# Patient Record
Sex: Female | Born: 1965 | Race: White | Hispanic: No | Marital: Married | Smoking: Never smoker
Health system: Southern US, Community
[De-identification: ages and names within clinical notes are randomized; demographics above are authoritative.]

## PROBLEM LIST (undated history)

## (undated) DIAGNOSIS — F419 Anxiety disorder, unspecified: Secondary | ICD-10-CM

## (undated) DIAGNOSIS — M810 Age-related osteoporosis without current pathological fracture: Secondary | ICD-10-CM

## (undated) HISTORY — PX: CHOLECYSTECTOMY: SHX55

## (undated) HISTORY — PX: BACK SURGERY: SHX140

## (undated) HISTORY — PX: TONSILLECTOMY: SUR1361

## (undated) HISTORY — PX: OTHER SURGICAL HISTORY: SHX169

## (undated) HISTORY — PX: GASTRIC BYPASS: SHX52

## (undated) HISTORY — PX: ABDOMINAL HYSTERECTOMY: SHX81

## (undated) HISTORY — PX: CARPAL TUNNEL RELEASE: SHX101

## (undated) HISTORY — PX: TUBAL LIGATION: SHX77

---
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
## 2002-03-30 ENCOUNTER — Ambulatory Visit (HOSPITAL_COMMUNITY): Admission: RE | Admit: 2002-03-30 | Discharge: 2002-03-30 | Payer: Self-pay | Admitting: Family Medicine

## 2002-03-30 ENCOUNTER — Encounter: Payer: Self-pay | Admitting: Family Medicine

## 2002-09-15 ENCOUNTER — Emergency Department (HOSPITAL_COMMUNITY): Admission: EM | Admit: 2002-09-15 | Discharge: 2002-09-15 | Payer: Self-pay | Admitting: *Deleted

## 2002-09-15 ENCOUNTER — Encounter: Payer: Self-pay | Admitting: *Deleted

## 2002-09-28 ENCOUNTER — Encounter: Payer: Self-pay | Admitting: Family Medicine

## 2002-09-28 ENCOUNTER — Encounter: Admission: RE | Admit: 2002-09-28 | Discharge: 2002-09-28 | Payer: Self-pay | Admitting: Family Medicine

## 2003-02-15 ENCOUNTER — Other Ambulatory Visit: Admission: RE | Admit: 2003-02-15 | Discharge: 2003-02-15 | Payer: Self-pay | Admitting: Obstetrics and Gynecology

## 2003-03-24 ENCOUNTER — Emergency Department (HOSPITAL_COMMUNITY): Admission: EM | Admit: 2003-03-24 | Discharge: 2003-03-24 | Payer: Self-pay | Admitting: Internal Medicine

## 2003-07-20 ENCOUNTER — Emergency Department (HOSPITAL_COMMUNITY): Admission: EM | Admit: 2003-07-20 | Discharge: 2003-07-21 | Payer: Self-pay | Admitting: Emergency Medicine

## 2003-07-20 ENCOUNTER — Encounter: Payer: Self-pay | Admitting: Emergency Medicine

## 2003-08-29 ENCOUNTER — Emergency Department (HOSPITAL_COMMUNITY): Admission: EM | Admit: 2003-08-29 | Discharge: 2003-08-29 | Payer: Self-pay | Admitting: Emergency Medicine

## 2004-06-16 ENCOUNTER — Emergency Department (HOSPITAL_COMMUNITY): Admission: EM | Admit: 2004-06-16 | Discharge: 2004-06-16 | Payer: Self-pay | Admitting: Emergency Medicine

## 2004-11-29 ENCOUNTER — Ambulatory Visit (HOSPITAL_COMMUNITY): Admission: RE | Admit: 2004-11-29 | Discharge: 2004-11-29 | Payer: Self-pay | Admitting: Internal Medicine

## 2004-11-30 ENCOUNTER — Ambulatory Visit (HOSPITAL_COMMUNITY): Admission: RE | Admit: 2004-11-30 | Discharge: 2004-12-01 | Payer: Self-pay | Admitting: Neurosurgery

## 2004-12-08 ENCOUNTER — Emergency Department (HOSPITAL_COMMUNITY): Admission: EM | Admit: 2004-12-08 | Discharge: 2004-12-08 | Payer: Self-pay | Admitting: Emergency Medicine

## 2005-10-10 ENCOUNTER — Ambulatory Visit (HOSPITAL_COMMUNITY): Admission: RE | Admit: 2005-10-10 | Discharge: 2005-10-10 | Payer: Self-pay | Admitting: Internal Medicine

## 2005-11-13 ENCOUNTER — Ambulatory Visit (HOSPITAL_COMMUNITY): Admission: RE | Admit: 2005-11-13 | Discharge: 2005-11-13 | Payer: Self-pay | Admitting: Obstetrics & Gynecology

## 2006-03-27 ENCOUNTER — Ambulatory Visit (HOSPITAL_COMMUNITY): Admission: RE | Admit: 2006-03-27 | Discharge: 2006-03-27 | Payer: Self-pay | Admitting: Orthopaedic Surgery

## 2006-12-12 ENCOUNTER — Ambulatory Visit (HOSPITAL_COMMUNITY): Admission: RE | Admit: 2006-12-12 | Discharge: 2006-12-12 | Payer: Self-pay | Admitting: Family Medicine

## 2010-11-10 ENCOUNTER — Encounter: Payer: Self-pay | Admitting: Internal Medicine

## 2012-05-12 ENCOUNTER — Encounter (HOSPITAL_COMMUNITY): Payer: Self-pay

## 2012-05-12 ENCOUNTER — Emergency Department (HOSPITAL_COMMUNITY)
Admission: EM | Admit: 2012-05-12 | Discharge: 2012-05-12 | Disposition: A | Discharge status: Home / Self Care | Payer: Self-pay | Attending: Emergency Medicine | Admitting: Emergency Medicine

## 2012-05-12 DIAGNOSIS — H109 Unspecified conjunctivitis: Secondary | ICD-10-CM

## 2012-05-12 DIAGNOSIS — H5789 Other specified disorders of eye and adnexa: Secondary | ICD-10-CM | POA: Insufficient documentation

## 2012-05-12 MED ORDER — TOBRAMYCIN 0.3 % OP SOLN
2.0000 [drp] | Freq: Once | OPHTHALMIC | Status: AC
  Administered 2012-05-12: 2 [drp] via OPHTHALMIC
  Filled 2012-05-12: qty 5

## 2012-05-12 MED ORDER — FLUORESCEIN SODIUM 1 MG OP STRP
ORAL_STRIP | OPHTHALMIC | Status: AC
  Administered 2012-05-12: 14:00:00
  Filled 2012-05-12: qty 1

## 2012-05-12 MED ORDER — TETRACAINE HCL 0.5 % OP SOLN
OPHTHALMIC | Status: AC
  Administered 2012-05-12: 14:00:00
  Filled 2012-05-12: qty 2

## 2012-05-12 NOTE — ED Notes (Signed)
Pt c/o r eye irritation since this morning.  Says did some cleaning at work yesterday and thinks may have gotten something in her R eye.

## 2012-05-12 NOTE — ED Provider Notes (Signed)
Medical screening examination/treatment/procedure(s) were performed by non-physician practitioner and as supervising physician I was immediately available for consultation/collaboration.   Shelda Jakes, MD 05/12/12 2038

## 2012-05-12 NOTE — ED Notes (Signed)
Pt c/o rt eye irritation since this am. Pt c/o rt being matted closed this morning and has been tearing up a lot today.

## 2012-05-12 NOTE — ED Notes (Signed)
Patient states she does not wear glasses or contacts. 

## 2012-05-12 NOTE — ED Provider Notes (Signed)
History     CSN: [id number]  Arrival date & time 05/12/12  1306   First MD Initiated Contact with Patient 05/12/12 1318      Chief Complaint  Patient presents with  . Eye Pain    (Consider location/radiation/quality/duration/timing/severity/associated sxs/prior treatment) HPI Comments: States she awakened today with R eye discomfort.  She and co-workers were doing cleaning at work yesterday and thought she might have splashed a cleaning chemical in her eye although does not know that for sure.  Eye matted closed this AM.  Patient is a 46 y.o. female presenting with eye pain. The history is provided by the patient. No language interpreter was used.  Eye Pain This is a new problem. The current episode started today. The problem occurs constantly. The problem has been unchanged. Pertinent negatives include no chills, coughing or visual change. Nothing aggravates the symptoms. She has tried nothing for the symptoms.    History reviewed. No pertinent past medical history.  Past Surgical History  Procedure Date  . Tubal ligation   . Cyst removed from lower back   . Abdominal hysterectomy     partial  . Tonsillectomy   . Cholecystectomy   . Carpal tunnel release     No family history on file.  History  Substance Use Topics  . Smoking status: Never Smoker   . Smokeless tobacco: Not on file  . Alcohol Use: No    OB History    Grav Para Term Preterm Abortions TAB SAB Ect Mult Living                  Review of Systems  Constitutional: Negative for chills.  Eyes: Positive for pain, discharge and redness. Negative for photophobia and visual disturbance.  Respiratory: Negative for cough.   All other systems reviewed and are negative.    Allergies  Penicillins  Home Medications   Current Outpatient Rx  Name Route Sig Dispense Refill  . ALPRAZOLAM 0.5 MG PO TABS Oral Take 0.5 mg by mouth 3 (three) times daily.    . OXYCODONE-ACETAMINOPHEN 5-325 MG PO TABS Oral Take 1  tablet by mouth 2 (two) times daily. Pain    . TETRAHYDROZOLINE-ZN SULFATE 0.05-0.25 % OP SOLN Both Eyes Place 2 drops into both eyes 3 (three) times daily as needed. Clean out eyes      BP 131/85  Pulse 101  Temp 98 F (36.7 C) (Oral)  Resp 18  Ht 5' 2.5" (1.588 m)  Wt 215 lb (97.523 kg)  BMI 38.70 kg/m2  SpO2 99%  Physical Exam  Nursing note and vitals reviewed. Constitutional: She is oriented to person, place, and time. She appears well-developed and well-nourished. No distress.  HENT:  Head: Normocephalic and atraumatic.  Eyes: EOM are normal. Pupils are equal, round, and reactive to light. Right eye exhibits no discharge, no exudate and no hordeolum. Left eye exhibits no discharge, no exudate and no hordeolum. Right conjunctiva is injected. Right conjunctiva has no hemorrhage. Left conjunctiva is not injected. Left conjunctiva has no hemorrhage. No scleral icterus. Right eye exhibits no nystagmus. Left eye exhibits no nystagmus.       anesth R eye with tetracaine and stained with florosceine.  No stain uptake to suggest chemical conjunctivitis/  Neck: Normal range of motion.  Cardiovascular: Normal rate, regular rhythm and normal heart sounds.   Pulmonary/Chest: Effort normal and breath sounds normal.  Abdominal: Soft. She exhibits no distension. There is no tenderness.  Musculoskeletal: Normal range of motion.  Neurological: She is alert and oriented to person, place, and time.  Skin: Skin is warm and dry.  Psychiatric: She has a normal mood and affect. Judgment normal.    ED Course  Procedures (including critical care time)  Labs Reviewed - No data to display No results found.   No diagnosis found.    MDM  Insert 2 drops of tobrex OU QID x 5-7 days. F/u with ophthalmologist of choice.        Evalina Field, [place] 05/12/12 1418

## 2012-08-19 ENCOUNTER — Other Ambulatory Visit (HOSPITAL_COMMUNITY): Payer: Self-pay | Admitting: Family Medicine

## 2012-08-19 DIAGNOSIS — Z139 Encounter for screening, unspecified: Secondary | ICD-10-CM

## 2012-08-24 ENCOUNTER — Ambulatory Visit (HOSPITAL_COMMUNITY)
Admission: RE | Admit: 2012-08-24 | Discharge: 2012-08-24 | Disposition: A | Discharge status: Home / Self Care | Payer: BC Managed Care – PPO | Source: Ambulatory Visit | Attending: Family Medicine | Admitting: Family Medicine

## 2012-08-24 DIAGNOSIS — Z139 Encounter for screening, unspecified: Secondary | ICD-10-CM

## 2012-08-24 DIAGNOSIS — Z1231 Encounter for screening mammogram for malignant neoplasm of breast: Secondary | ICD-10-CM | POA: Insufficient documentation

## 2012-11-16 ENCOUNTER — Emergency Department (HOSPITAL_COMMUNITY)
Admission: EM | Admit: 2012-11-16 | Discharge: 2012-11-16 | Disposition: A | Discharge status: Home / Self Care | Payer: BC Managed Care – PPO | Attending: Emergency Medicine | Admitting: Emergency Medicine

## 2012-11-16 ENCOUNTER — Encounter (HOSPITAL_COMMUNITY): Payer: Self-pay | Admitting: *Deleted

## 2012-11-16 DIAGNOSIS — K0889 Other specified disorders of teeth and supporting structures: Secondary | ICD-10-CM

## 2012-11-16 DIAGNOSIS — K029 Dental caries, unspecified: Secondary | ICD-10-CM | POA: Insufficient documentation

## 2012-11-16 DIAGNOSIS — Z79899 Other long term (current) drug therapy: Secondary | ICD-10-CM | POA: Insufficient documentation

## 2012-11-16 MED ORDER — CLINDAMYCIN HCL 150 MG PO CAPS: ORAL_CAPSULE | ORAL | Status: DC

## 2012-11-16 MED ORDER — NAPROXEN 250 MG PO TABS: 250.0000 mg | ORAL_TABLET | Freq: Two times a day (BID) | ORAL | Status: DC

## 2012-11-16 MED ORDER — HYDROCODONE-ACETAMINOPHEN 5-325 MG PO TABS: ORAL_TABLET | ORAL | Status: DC

## 2012-11-16 NOTE — ED Notes (Signed)
Pain lt mandibular molar for 2 days, sensitive to heat and cold.

## 2012-11-16 NOTE — ED Notes (Signed)
Dental pain to left side x 2 days.  

## 2012-11-16 NOTE — ED Provider Notes (Signed)
History     CSN: [id number]  Arrival date & time 11/16/12  1733   First MD Initiated Contact with Patient 11/16/12 1741      Chief Complaint  Patient presents with  . Dental Pain     HPI Pt was seen at 1750.  Per pt, c/o gradual onset and persistence of constant left lower tooth "pain" for the past several days.  Denies fevers, no intra-oral edema, no rash, no facial swelling, no dysphagia, no neck pain.   The condition is aggravated by nothing. The condition is relieved by nothing. The symptoms have been associated with no other complaints. The patient has no significant history of serious medical conditions.     History reviewed. No pertinent past medical history.  Past Surgical History  Procedure Date  . Tubal ligation   . Cyst removed from lower back   . Abdominal hysterectomy     partial  . Tonsillectomy   . Cholecystectomy   . Carpal tunnel release     History  Substance Use Topics  . Smoking status: Never Smoker   . Smokeless tobacco: Not on file  . Alcohol Use: No      Review of Systems ROS: Statement: All systems negative except as marked or noted in the HPI; Constitutional: Negative for fever and chills. ; ; Eyes: Negative for eye pain and discharge. ; ; ENMT: Positive for dental caries, dental hygiene poor and toothache. Negative for ear pain, bleeding gums, dental injury, facial deformity, facial swelling, hoarseness, nasal congestion, sinus pressure, sore throat, throat swelling and tongue swollen. ; ; Cardiovascular: Negative for chest pain, palpitations, diaphoresis, dyspnea and peripheral edema. ; ; Respiratory: Negative for cough, wheezing and stridor. ; ; Gastrointestinal: Negative for nausea, vomiting, diarrhea and abdominal pain. ; ; Genitourinary: Negative for dysuria, flank pain and hematuria. ; ; Musculoskeletal: Negative for back pain and neck pain. ; ; Skin: Negative for rash and skin lesion. ; ; Neuro: Negative for headache, lightheadedness and neck  stiffness. ;    Allergies  Penicillins  Home Medications   Current Outpatient Rx  Name  Route  Sig  Dispense  Refill  . ALPRAZOLAM 0.5 MG PO TABS   Oral   Take 0.5 mg by mouth 3 (three) times daily.         Marland Kitchen CLINDAMYCIN HCL 150 MG PO CAPS      3 tabs PO TID x 10 days   90 capsule   0   . HYDROCODONE-ACETAMINOPHEN 5-325 MG PO TABS      1 or 2 tabs PO q6 hours prn pain   20 tablet   0   . NAPROXEN 250 MG PO TABS   Oral   Take 1 tablet (250 mg total) by mouth 2 (two) times daily with a meal.   14 tablet   0   . OXYCODONE-ACETAMINOPHEN 5-325 MG PO TABS   Oral   Take 1 tablet by mouth 2 (two) times daily. Pain         . TETRAHYDROZOLINE-ZN SULFATE 0.05-0.25 % OP SOLN   Both Eyes   Place 2 drops into both eyes 3 (three) times daily as needed. Clean out eyes           BP 127/76  Pulse 83  Temp 98 F (36.7 C) (Oral)  Resp 20  Ht 5\' 3"  (1.6 m)  Wt 220 lb (99.791 kg)  BMI 38.97 kg/m2  SpO2 96%  Physical Exam 1755: Physical examination: Vital signs and  O2 SAT: Reviewed; Constitutional: Well developed, Well nourished, Well hydrated, In no acute distress; Head and Face: Normocephalic, Atraumatic; Eyes: EOMI, PERRL, No scleral icterus; ENMT: Mouth and pharynx normal, Poor dentition, Widespread dental decay, Left TM normal, Right TM normal, Mucous membranes moist, +lower left 2nd molar with dental decay.  No gingival erythema, edema, fluctuance, or drainage.  No intra-oral edema. No hoarse voice, no drooling, no stridor.  ; Neck: Supple, Full range of motion, No lymphadenopathy; Cardiovascular: Regular rate and rhythm, No murmur, rub, or gallop; Respiratory: Breath sounds clear & equal bilaterally, No rales, rhonchi, wheezes, or rub, Normal respiratory effort/excursion; Chest: Nontender, Movement normal; Extremities: Pulses normal, No tenderness, No edema; Neuro: AA&Ox3, Major CN grossly intact.  No gross focal motor or sensory deficits in extremities.; Skin: Color  normal, No rash, No petechiae, Warm, Dry   ED Course  Procedures    MDM  MDM Reviewed: nursing note, vitals and previous chart     1800:  Pt encouraged to f/u with dentist or oral surgeon for her dental needs for good continuity of care and definitive treatment.  Verb understanding.         Laray Anger, DO 11/17/12 2206

## 2013-06-22 ENCOUNTER — Ambulatory Visit: Payer: BC Managed Care – PPO | Admitting: Dietician

## 2013-07-02 ENCOUNTER — Encounter: Payer: Self-pay | Admitting: *Deleted

## 2013-07-02 ENCOUNTER — Encounter: Payer: BC Managed Care – PPO | Attending: Family Medicine | Admitting: *Deleted

## 2013-07-02 VITALS — Ht 63.0 in | Wt 236.4 lb

## 2013-07-02 DIAGNOSIS — E669 Obesity, unspecified: Secondary | ICD-10-CM | POA: Insufficient documentation

## 2013-07-02 DIAGNOSIS — Z713 Dietary counseling and surveillance: Secondary | ICD-10-CM | POA: Insufficient documentation

## 2013-07-02 NOTE — Progress Notes (Signed)
  Medical Nutrition Therapy:  Appt start time: 0930 end time:  1030.  Assessment:  Primary concerns today: Krista Fitzgerald is here for nutrition counseling pertaining to obesity.   She has gained 65-70 pounds over the years from stress and inactivity.  She has a strong family history of diabetes, HTN, and hyperlipidemia.  She is currently healthy except for bad back pain.  She's had surgery on her back, but she still in pain. She believes that the pain will get better with weight loss.   She is currently not working and is stressed about her finances.  She would like to have the lap band bariatric surgery.    She had made some changes recently.  Cut out fried foods and is limiting sodas.  She used to eat a lot of chips, sweets, snacks, etc,  She eats mindless because of her depression.  She still eats very few fruits and vegetable or whole grains.  Her fat intake is higher than recommended  MEDICATIONS: see list   DIETARY INTAKE:  Usual eating pattern includes 2-3 meals and multiple snacks per day.  Everyday foods include protein, starch.  Avoided foods include seafood, pork products.  Doesn't eat many fruits or vegeteables  24-hr recall: 1500 calories B ( AM): 1/2 blt or toast with egg Snk ( AM): not usually  L ( PM): lasagna or sandwich  Snk ( PM): almonds or pistachios, fudgesicle D ( PM): pizza Snk ( PM): not usually Beverages: soda sometimes, unsweetened tea  Usual physical activity: walks 20-45 minutes most days  Estimated energy needs: 1500 calories 170 g carbohydrates 112 g protein 42 g fat    Nutritional Diagnosis:  [place]-3.3 Overweight/obesity As related to excessive fat and sugar intake combined with limited phyiscal activity.  As evidenced by BMI>30.    Intervention:  Nutrition counseling provided. Goals:  Eat 3 meals/day, Avoid meal skipping   Increase protein rich foods  Follow "Plate Method" for portion control  Limit carbohydrate1-2 servings/meal   Choose more  whole grains, lean protein, low-fat dairy, and fruits/non-starchy vegetables.   Aim for >30 min of physical activity daily  Limit sugar-sweetened beverages and concentrated sweets 20% protein 55% carb 25% fat  Monitoring/Evaluation:  Dietary intake, exercise, and body weight prn.

## 2013-07-02 NOTE — Patient Instructions (Addendum)
Goals:  Eat 3 meals/day, Avoid meal skipping   Increase protein rich foods  Follow "Plate Method" for portion control  Limit carbohydrate1-2 servings/meal   Choose more whole grains, lean protein, low-fat dairy, and fruits/non-starchy vegetables.   Aim for >30 min of physical activity daily  Limit sugar-sweetened beverages and concentrated sweets   20% protein 55% carb 25% fat

## 2013-08-07 ENCOUNTER — Emergency Department (HOSPITAL_COMMUNITY)
Admission: EM | Admit: 2013-08-07 | Discharge: 2013-08-07 | Disposition: A | Discharge status: Home / Self Care | Payer: No Typology Code available for payment source | Attending: Emergency Medicine | Admitting: Emergency Medicine

## 2013-08-07 ENCOUNTER — Encounter (HOSPITAL_COMMUNITY): Payer: Self-pay | Admitting: Emergency Medicine

## 2013-08-07 ENCOUNTER — Emergency Department (HOSPITAL_COMMUNITY): Payer: No Typology Code available for payment source

## 2013-08-07 DIAGNOSIS — S139XXA Sprain of joints and ligaments of unspecified parts of neck, initial encounter: Secondary | ICD-10-CM | POA: Diagnosis not present

## 2013-08-07 DIAGNOSIS — Z880 Allergy status to penicillin: Secondary | ICD-10-CM | POA: Diagnosis not present

## 2013-08-07 DIAGNOSIS — Y92410 Unspecified street and highway as the place of occurrence of the external cause: Secondary | ICD-10-CM | POA: Insufficient documentation

## 2013-08-07 DIAGNOSIS — S0993XA Unspecified injury of face, initial encounter: Secondary | ICD-10-CM | POA: Diagnosis not present

## 2013-08-07 DIAGNOSIS — Y9389 Activity, other specified: Secondary | ICD-10-CM | POA: Insufficient documentation

## 2013-08-07 DIAGNOSIS — IMO0002 Reserved for concepts with insufficient information to code with codable children: Secondary | ICD-10-CM | POA: Diagnosis present

## 2013-08-07 DIAGNOSIS — Z79899 Other long term (current) drug therapy: Secondary | ICD-10-CM | POA: Diagnosis not present

## 2013-08-07 DIAGNOSIS — Z791 Long term (current) use of non-steroidal anti-inflammatories (NSAID): Secondary | ICD-10-CM | POA: Diagnosis not present

## 2013-08-07 DIAGNOSIS — S161XXA Strain of muscle, fascia and tendon at neck level, initial encounter: Secondary | ICD-10-CM

## 2013-08-07 MED ORDER — HYDROCODONE-ACETAMINOPHEN 5-325 MG PO TABS
1.0000 | ORAL_TABLET | Freq: Once | ORAL | Status: AC
  Administered 2013-08-07: 1 via ORAL
  Filled 2013-08-07: qty 1

## 2013-08-07 MED ORDER — OXYCODONE-ACETAMINOPHEN 5-325 MG PO TABS: 1.0000 | ORAL_TABLET | ORAL | Status: DC | PRN

## 2013-08-07 MED ORDER — CYCLOBENZAPRINE HCL 5 MG PO TABS: 5.0000 mg | ORAL_TABLET | Freq: Three times a day (TID) | ORAL | Status: DC | PRN
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
## 2013-08-07 NOTE — ED Notes (Signed)
c-collar placed in triage, d/t complaints of neck/back pain.

## 2013-08-07 NOTE — ED Provider Notes (Signed)
Medical screening examination/treatment/procedure(s) were performed by non-physician practitioner and as supervising physician I was immediately available for consultation/collaboration.   Lyanne Co, MD 08/07/13 [id number]

## 2013-11-19 ENCOUNTER — Encounter (HOSPITAL_COMMUNITY): Payer: Self-pay | Admitting: Emergency Medicine

## 2013-11-19 ENCOUNTER — Emergency Department (HOSPITAL_COMMUNITY)
Admission: EM | Admit: 2013-11-19 | Discharge: 2013-11-19 | Disposition: A | Discharge status: Home / Self Care | Payer: BC Managed Care – PPO | Attending: Emergency Medicine | Admitting: Emergency Medicine

## 2013-11-19 DIAGNOSIS — K0889 Other specified disorders of teeth and supporting structures: Secondary | ICD-10-CM

## 2013-11-19 DIAGNOSIS — Z880 Allergy status to penicillin: Secondary | ICD-10-CM | POA: Insufficient documentation

## 2013-11-19 DIAGNOSIS — Z791 Long term (current) use of non-steroidal anti-inflammatories (NSAID): Secondary | ICD-10-CM | POA: Insufficient documentation

## 2013-11-19 DIAGNOSIS — K089 Disorder of teeth and supporting structures, unspecified: Secondary | ICD-10-CM | POA: Insufficient documentation

## 2013-11-19 DIAGNOSIS — Z79899 Other long term (current) drug therapy: Secondary | ICD-10-CM | POA: Insufficient documentation

## 2013-11-19 DIAGNOSIS — K029 Dental caries, unspecified: Secondary | ICD-10-CM | POA: Insufficient documentation

## 2013-11-19 DIAGNOSIS — F411 Generalized anxiety disorder: Secondary | ICD-10-CM | POA: Insufficient documentation

## 2013-11-19 HISTORY — DX: Anxiety disorder, unspecified: F41.9

## 2013-11-19 MED ORDER — KETOROLAC TROMETHAMINE 10 MG PO TABS
10.0000 mg | ORAL_TABLET | Freq: Once | ORAL | Status: AC
  Administered 2013-11-19: 10 mg via ORAL
  Filled 2013-11-19: qty 1

## 2013-11-19 MED ORDER — DICLOFENAC SODIUM 75 MG PO TBEC: 75.0000 mg | DELAYED_RELEASE_TABLET | Freq: Two times a day (BID) | ORAL | Status: DC

## 2013-11-19 MED ORDER — CLINDAMYCIN HCL 150 MG PO CAPS: 150.0000 mg | ORAL_CAPSULE | Freq: Four times a day (QID) | ORAL | Status: DC

## 2013-11-19 MED ORDER — CLINDAMYCIN HCL 150 MG PO CAPS
300.0000 mg | ORAL_CAPSULE | Freq: Once | ORAL | Status: AC
  Administered 2013-11-19: 300 mg via ORAL
  Filled 2013-11-19: qty 2
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
## 2013-11-19 NOTE — ED Provider Notes (Signed)
CSN: [id number]     Arrival date & time 11/19/13  2130 History   First MD Initiated Contact with Patient 11/19/13 2155     Chief Complaint  Patient presents with  . Dental Pain   (Consider location/radiation/quality/duration/timing/severity/associated sxs/prior Treatment) Patient is a 48 y.o. female presenting with tooth pain. The history is provided by the patient.  Dental Pain Location:  Upper Upper teeth location:  3/RU 1st molar Quality:  Throbbing and sharp Severity:  Moderate Onset quality:  Gradual Duration:  3 weeks Timing:  Constant Progression:  Worsening (pain to right upper tooth worsening for 3-4 days) Chronicity:  Chronic Context: dental caries and filling fell out   Context: not malocclusion, normal dentition, not recent dental surgery and not trauma   Prior workup: none. Relieved by:  Nothing Worsened by:  Hot food/drink and cold food/drink Ineffective treatments:  NSAIDs (oral narcotics) Associated symptoms: facial pain and gum swelling   Associated symptoms: no congestion, no difficulty swallowing, no drooling, no facial swelling, no fever, no headaches, no neck pain, no neck swelling, no oral bleeding, no oral lesions and no trismus   Risk factors: lack of dental care and periodontal disease   Risk factors: no diabetes and no smoking     Past Medical History  Diagnosis Date  . Anxiety    Past Surgical History  Procedure Laterality Date  . Tubal ligation    . Cyst removed from lower back    . Abdominal hysterectomy      partial  . Tonsillectomy    . Cholecystectomy    . Carpal tunnel release    . Back surgery     Family History  Problem Relation Age of Onset  . Diabetes Brother   . Hyperlipidemia Other   . Hypertension Other   . Heart disease Other   . Stroke Other    History  Substance Use Topics  . Smoking status: Never Smoker   . Smokeless tobacco: Not on file  . Alcohol Use: No   OB History   Grav Para Term Preterm Abortions TAB SAB  Ect Mult Living                 Review of Systems  Constitutional: Negative for fever and appetite change.  HENT: Positive for dental problem. Negative for congestion, drooling, facial swelling, mouth sores, sore throat and trouble swallowing.   Eyes: Negative for pain and visual disturbance.  Musculoskeletal: Negative for neck pain and neck stiffness.  Neurological: Negative for dizziness, facial asymmetry and headaches.  Hematological: Negative for adenopathy.  All other systems reviewed and are negative.    Allergies  Penicillins  Home Medications   Current Outpatient Rx  Name  Route  Sig  Dispense  Refill  . ALPRAZolam (XANAX) 0.5 MG tablet   Oral   Take 0.5 mg by mouth 3 (three) times daily.         Marland Kitchen oxyCODONE-acetaminophen (PERCOCET/ROXICET) 5-325 MG per tablet   Oral   Take 1 tablet by mouth 2 (two) times daily. Pain         . tetrahydrozoline-zinc (VISINE-AC) 0.05-0.25 % ophthalmic solution   Both Eyes   Place 2 drops into both eyes 3 (three) times daily as needed. Clean out eyes         . naproxen (NAPROSYN) 250 MG tablet   Oral   Take 1 tablet (250 mg total) by mouth 2 (two) times daily with a meal.   14 tablet   0  BP 128/68  Pulse 87  Temp(Src) 98.1 F (36.7 C) (Oral)  Resp 20  Ht 5\' 3"  (1.6 m)  Wt 240 lb (108.863 kg)  BMI 42.52 kg/m2  SpO2 98% Physical Exam  Nursing note and vitals reviewed. Constitutional: She is oriented to person, place, and time. She appears well-developed and well-nourished. No distress.  HENT:  Head: Normocephalic and atraumatic.  Right Ear: Tympanic membrane and ear canal normal.  Left Ear: Tympanic membrane and ear canal normal.  Mouth/Throat: Uvula is midline, oropharynx is clear and moist and mucous membranes are normal. No trismus in the jaw. Dental caries present. No dental abscesses or uvula swelling. No tonsillar abscesses.    Dental caries of #3 tooth.  No facial swelling, obvious dental abscess,  trismus, or sublingual abnml.  Patient has multiple dental fillings.  Neck: Normal range of motion. Neck supple.  Cardiovascular: Normal rate, regular rhythm and normal heart sounds.   No murmur heard. Pulmonary/Chest: Effort normal and breath sounds normal. No respiratory distress.  Musculoskeletal: Normal range of motion.  Lymphadenopathy:    She has no cervical adenopathy.  Neurological: She is alert and oriented to person, place, and time. She exhibits normal muscle tone. Coordination normal.  Skin: Skin is warm and dry.    ED Course  Procedures (including critical care time) Labs Review Labs Reviewed - No data to display Imaging Review No results found.  EKG Interpretation   None       MDM    Patient with dental pain. VVS.  She is uncomfortable appearing but non-toxic.  No concerning sx's for Ludwig's angina.  No obvious dental abscess.  Referral for local dentists given.  Will prescribe clindamycin and diclofenac for pain.  Patient stable for discharge.  Shawndale Kilpatrick L. Trisha Mangleriplett, PA-C 11/19/13 2222

## 2013-11-19 NOTE — Discharge Instructions (Signed)
Dental Pain Toothache is pain in or around a tooth. It may get worse with chewing or with cold or heat.  HOME CARE  Your dentist may use a numbing medicine during treatment. If so, you may need to avoid eating until the medicine wears off. Ask your dentist about this.  Only take medicine as told by your dentist or doctor.  Avoid chewing food near the painful tooth until after all treatment is done. Ask your dentist about this. GET HELP RIGHT AWAY IF:   The problem gets worse or new problems appear.  You have a fever.  There is redness and puffiness (swelling) of the face, jaw, or neck.  You cannot open your mouth.  There is pain in the jaw.  There is very bad pain that is not helped by medicine. MAKE SURE YOU:   Understand these instructions.  Will watch your condition.  Will get help right away if you are not doing well or get worse. Document Released: 03/25/2008 Document Revised: 12/30/2011 Document Reviewed: 03/25/2008 [facility] Patient Information 2014 [place], [place].   Emergency Department Resource Guide 1) Find a Doctor and Pay Out of Pocket Although you won't have to find out who is covered by your insurance plan, it is a good idea to ask around and get recommendations. You will then need to call the office and see if the doctor you have chosen will accept you as a new patient and what types of options they offer for patients who are self-pay. Some doctors offer discounts or will set up payment plans for their patients who do not have insurance, but you will need to ask so you aren't surprised when you get to your appointment.  2) Contact Your Local Health Department Not all health departments have doctors that can see patients for sick visits, but many do, so it is worth a call to see if yours does. If you don't know where your local health department is, you can check in your phone book. The CDC also has a tool to help you locate your state's health department, and many  state websites also have listings of all of their local health departments.  3) Find a [facility] If your illness is not likely to be very severe or complicated, you may want to try a walk in clinic. These are popping up all over the country in pharmacies, drugstores, and shopping centers. They're usually staffed by nurse practitioners or physician assistants that have been trained to treat common illnesses and complaints. They're usually fairly quick and inexpensive. However, if you have serious medical issues or chronic medical problems, these are probably not your best option.  No Primary Care Doctor: - Call Health Connect at  [phone] - they can help you locate a primary care doctor that  accepts your insurance, provides certain services, etc. - Physician Referral Service- [phone]  Chronic Pain Problems: Organization         Address  Phone   Notes  [facility]  [phone] Patients need to be referred by their primary care doctor.   Medication Assistance: Organization         Address  Phone   Notes  [facility] [address] [place],  [address] [phone] --Must be a resident of [facility] -- Must have NO insurance coverage whatsoever (no Medicaid/ Medicare, etc.) -- The pt. MUST have a primary care doctor that directs their care regularly and follows  them in the community   [facility]  [phone]   [employer]  [phone]    Agencies that provide inexpensive medical care: Organization         Address  Phone   Notes  [place]  [phone]   [facility]    [phone]   [facility] [place], [place] [address] [phone]   [place] [address], [place] [phone]   [facility]    [phone]   [facility]    [phone]   [place] and  [address], [place] Phone:  [phone], Fax:  [phone] Hours of Operation:  9 am - 6 pm, M-F.  Also accepts Medicaid/Medicare and self-pay.  [facility] for [address], [place] Phone: [phone], Fax: [phone]. Hours of Operation:  8:30 am - 5:30 pm, M-F.  Also accepts Medicaid and self-pay.  [facility] [place] [address] [place] Phone: [phone]   [place], [place], [place] [phone], Ext. [phone] Mondays & Thursdays: 7-9 AM.  First 15 patients are seen on a first come, first serve basis.    [place] Providers:  Organization         Address  Phone   Notes  [facility] [address], [place] [phone] Also accepts self-pay patients.  [facility] [address], [place]  [phone]   [place], [address], [place] [phone]   [facility] [address], [place] [phone]   Lucianne Lei [address], [place]   [phone] Only accepts [place] patients after they have their name applied to their card.   Self-Pay (no insurance) in [facility]:  Organization         Address  Phone   Notes  Sickle Cell Patients, [facility] [place] [phone]   [facility] [place] [phone]   [facility] [place]  [place], [place], [place] [phone]   [facility]/Dr. Osei-Bonsu  [address], [place] or [address], [place] [phone] Phone number for both Institute and [place] locations is the same.  [facility] [address], [place] [phone]   [facility] [address] [place] or [address] [phone]   [facility] [address], [place] [phone], phone; [phone], fax Sees patients 1st and 3rd Saturday of every month.  Must not qualify for public or private insurance (i.e. Medicaid, Medicare, [place] Health Choice, Veterans' Benefits)  Household income should be no more than 200% of the poverty level The clinic cannot treat you if you are pregnant or think you are pregnant  Sexually transmitted diseases are not treated at the clinic.    Dental Care: Organization         Address  Phone  Notes  [facility] [address] [place], [place] [phone] Accepts children up to age 15 who are enrolled  in Medicaid or [place] Health Choice; pregnant women with a Medicaid card; and children who have applied for Medicaid or [place] Health Choice, but were declined, whose parents can pay a reduced fee at time of service.  °[facility] [place]  [address], [place] ([phone]) [phone] Accepts children up to age 21 who are enrolled in Medicaid or [place] Health Choice; pregnant women with a Medicaid card; and children who have applied for Medicaid or [place] Health Choice, but were declined, whose parents can pay a reduced fee at time of service.  °[facility] ° [address], [place] ([phone]) [phone] Patients are seen by appointment only. Walk-ins are not accepted. [facility] will see patients 18 years of age and older. °Monday - Tuesday (8am-5pm) °Most Wednesdays (8:30-5pm) °$30 per visit, cash only  °[facility] ° [address], [place] ([phone]) [phone] Patients are seen by appointment only. Walk-ins are not accepted. [facility] will see patients 18 years of age and older. °One Wednesday Evening (Monthly: Volunteer Based).  $30 per visit, cash only  °[facility]  [phone] for adults; Children under age 4, call Graduate Pediatric Dentistry at [phone]. Children aged 4-14, please call [phone] to request a pediatric application. ° Dental services are provided in all areas of dental care including fillings, crowns and bridges, complete and partial dentures, implants, gum treatment, root canals, and extractions. Preventive care is also provided. Treatment is provided to both adults and children. °Patients are selected via a lottery and there is often a waiting list. °  °[facility] [address], °[place] ° ([phone]) [phone] [email or web address] °  °[facility] [address], [place], [place] (336)[phone], Ext. [phone] Second and Fourth Thursday of each month, opens at 6:30 AM; Clinic ends at 9 AM.  Patients are seen on a first-come first-served basis, and a limited number are seen during each clinic.  ° °[facility] ° [address], [place], [place] ([phone]) [phone]   Eligibility Requirements °You must have lived in [place], [place], or [place] counties for at least the last three months. °  You cannot be eligible for state or federal sponsored healthcare insurance, including Veterans Administration, Medicaid, or Medicare. °  You generally cannot be eligible for healthcare insurance through your employer.  °  How to apply: °Eligibility screenings are held every Tuesday and Wednesday afternoon from 1:00 pm until 4:00 pm. You do not need an appointment for the interview!  °[facility] [address], [place], [place] [phone]   °[facility]  336-342-8273   °[facility]  336-703-3100   °[facility]  336-570-6415   ° °Behavioral Health Resources in the Community: °Intensive Outpatient Programs °Organization         Address  Phone  Notes  °[facility] [address], [place], [place]  336-878-6098   ° Health Outpatient [address], [place], [place] 336-832-9800   °[facility] [address], [place], [place] ° 336-882-2125   °[facility] [address],  °[place], [place] 1-800-853-5163 or 336-641-4981   °Substance Abuse Resources °Organization         Address  Phone  Notes  °[facility]  336-882-2125   °[facility]  336-784-9470   °  [facility]  336-285-9073   °[facility]  336-845-3988   °Residential & Outpatient Substance Abuse Program  1-800-659-3381   °Psychological Services °Organization         Address  Phone  Notes  °[facility]  336- 832-9600   °[facility]  336- 378-7881   °[facility] [address], [place] 1-800-853-5163 or 336-641-4981   ° °Mobile Crisis Teams °Organization         Address  Phone  Notes  °[facility], Mobile Crisis Care Unit  1-877-626-1772   °Assertive °[facility] ° [address] [place], [place] 336-834-9664   °Sharon DeEsch [address] °[place] 336-554-5454   ° °Self-Help/Support Groups °Organization         Address  Phone             Notes  °[facility] [place] - variety of support groups  336- 373-1402 Call for more information  °Narcotics Anonymous (NA), [facility] [address], °[place]  2 meetings at this location  ° °Residential Treatment Programs °Organization         Address  Phone  Notes  °[facility] [address],    °[place]  1-866-801-8205   °[facility] ° [address], [place], [place] [phone]   °[facility] [address], [place] 336-845-3988 Admissions: 8am-3pm M-F  °[facility] [address]    °[place], [place] 336-841-1104   °[facility] [address], [place], [place] 336-379-7146   °[facility] [address]    °[place], [place] 336-285-9073   °[facility] - Intensive Outpatient [address], [place], [place] 336-852-3033   °[facility] [address]  °[place], [place] 1-877-615-2722 or 336-784-9470   °[facility] ([facility]) [address] [place], [place] 336-227-7417 Accepts Medicaid  °[facility] [address]  ° [place] 1-800-659-3381 Substance Abuse/Addiction Treatment  ° °[place] [facility] Resources °Organization         Address  Phone  Notes  °[facility]  (888) 581-9988   °Julie Brannon, PhD [address] [place], [place]   (336) 349-5553 or (336) 951-0000   °[facility]   [address] °[place], [place] (336) 349-4454   °[facility] [address], [place], [place] (336) 342-8316 Insurance/Medicaid/sponsorship through [facility]  °[facility] [address]                                    [place], [place] (336) 342-8316 Therapy/tele-psych/case  °[facility] [address]  ° [place], [place] (336) 349-2233    °Dr. Arfeen  (336) 349-4544   °[facility] [place]  [employer] [facility] [facility] 1) [address], [place] °2) [address], [place] °3)  [address], [place] (336) 349-3220 °(336) 342-7768 ° °(336) 342-8140   °[place] Child Abuse Hotline (336) 342-1394 or (336) 342-3537 (After Hours)    ° ° ° °

## 2013-11-19 NOTE — ED Provider Notes (Signed)
Medical screening examination/treatment/procedure(s) were performed by non-physician practitioner and as supervising physician I was immediately available for consultation/collaboration.  EKG Interpretation   None         Benny LennertJoseph L Michail Boyte, MD 11/19/13 2255

## 2014-03-18 ENCOUNTER — Other Ambulatory Visit: Payer: Self-pay | Admitting: Radiology

## 2014-03-25 ENCOUNTER — Encounter (HOSPITAL_COMMUNITY): Payer: Self-pay | Admitting: Pharmacy Technician

## 2014-03-25 ENCOUNTER — Encounter (HOSPITAL_COMMUNITY)
Admission: RE | Admit: 2014-03-25 | Discharge: 2014-03-25 | Disposition: A | Discharge status: Home / Self Care | Payer: BC Managed Care – PPO | Source: Ambulatory Visit | Attending: Orthopaedic Surgery | Admitting: Orthopaedic Surgery

## 2014-03-25 ENCOUNTER — Encounter (HOSPITAL_COMMUNITY): Payer: Self-pay

## 2014-03-25 DIAGNOSIS — Z01812 Encounter for preprocedural laboratory examination: Secondary | ICD-10-CM | POA: Insufficient documentation

## 2014-03-25 LAB — URINE MICROSCOPIC-ADD ON

## 2014-03-25 LAB — URINALYSIS, ROUTINE W REFLEX MICROSCOPIC
BILIRUBIN URINE: NEGATIVE
Glucose, UA: NEGATIVE mg/dL
Ketones, ur: NEGATIVE mg/dL
Leukocytes, UA: NEGATIVE
Nitrite: NEGATIVE
PH: 6 (ref 5.0–8.0)
Protein, ur: NEGATIVE mg/dL
SPECIFIC GRAVITY, URINE: 1.025 (ref 1.005–1.030)
Urobilinogen, UA: 0.2 mg/dL (ref 0.0–1.0)

## 2014-03-25 LAB — COMPREHENSIVE METABOLIC PANEL
ALT: 21 U/L (ref 0–35)
AST: 21 U/L (ref 0–37)
Albumin: 3.4 g/dL — ABNORMAL LOW (ref 3.5–5.2)
Alkaline Phosphatase: 73 U/L (ref 39–117)
BUN: 11 mg/dL (ref 6–23)
CALCIUM: 9.2 mg/dL (ref 8.4–10.5)
CO2: 26 mEq/L (ref 19–32)
Chloride: 103 mEq/L (ref 96–112)
Creatinine, Ser: 0.66 mg/dL (ref 0.50–1.10)
GFR calc Af Amer: >90 mL/min (ref >90)
GFR calc non Af Amer: >90 mL/min (ref >90)
Glucose, Bld: 161 mg/dL — ABNORMAL HIGH (ref 70–99)
Potassium: 4.3 mEq/L (ref 3.7–5.3)
SODIUM: 140 meq/L (ref 137–147)
TOTAL PROTEIN: 7.4 g/dL (ref 6.0–8.3)
Total Bilirubin: 0.2 mg/dL — ABNORMAL LOW (ref 0.3–1.2)

## 2014-03-25 LAB — CBC WITH DIFFERENTIAL/PLATELET
Basophils Absolute: 0 10*3/uL (ref 0.0–0.1)
Basophils Relative: 0 % (ref 0–1)
EOS ABS: 0.2 10*3/uL (ref 0.0–0.7)
EOS PCT: 4 % (ref 0–5)
HCT: 36.4 % (ref 36.0–46.0)
Hemoglobin: 12.1 g/dL (ref 12.0–15.0)
Lymphocytes Relative: 40 % (ref 12–46)
Lymphs Abs: 2.5 10*3/uL (ref 0.7–4.0)
MCH: 29.7 pg (ref 26.0–34.0)
MCHC: 33.2 g/dL (ref 30.0–36.0)
MCV: 89.4 fL (ref 78.0–100.0)
Monocytes Absolute: 0.2 10*3/uL (ref 0.1–1.0)
Monocytes Relative: 4 % (ref 3–12)
NEUTROS PCT: 53 % (ref 43–77)
Neutro Abs: 3.3 10*3/uL (ref 1.7–7.7)
Platelets: 321 10*3/uL (ref 150–400)
RBC: 4.07 MIL/uL (ref 3.87–5.11)
RDW: 12.5 % (ref 11.5–15.5)
WBC: 6.3 10*3/uL (ref 4.0–10.5)

## 2014-03-25 NOTE — Patient Instructions (Signed)
Krista Fitzgerald  03/25/2014   Your procedure is scheduled on:  04/07/2014  Report to [facility] at  615  AM.  Call this number if you have problems the morning of surgery: [phone]   Remember:   Do not eat food or drink liquids after midnight.   Take these medicines the morning of surgery with A SIP OF WATER:  Xanax, oxycodone   Do not wear jewelry, make-up or nail polish.  Do not wear lotions, powders, or perfumes.   Do not shave 48 hours prior to surgery. Men may shave face and neck.  Do not bring valuables to the hospital.  [facility] is not responsible for any belongings or valuables.               Contacts, dentures or bridgework may not be worn into surgery.  Leave suitcase in the car. After surgery it may be brought to your room.  For patients admitted to the hospital, discharge time is determined by your treatment team.               Patients discharged the day of surgery will not be allowed to drive home.  Name and phone number of your driver: family  Special Instructions: Shower using CHG 2 nights before surgery and the night before surgery.  If you shower the day of surgery use CHG.  Use special wash - you have one bottle of CHG for all showers.  You should use approximately 1/3 of the bottle for each shower.   Please read over the following fact sheets that you were given: Pain Booklet, Coughing and Deep Breathing, Surgical Site Infection Prevention, Anesthesia Post-op Instructions and Care and Recovery After Surgery Carpal Tunnel Release Carpal tunnel release is done to relieve the pressure on the nerves and tendons on the bottom side of your wrist.  LET YOUR CAREGIVER KNOW ABOUT:   Allergies to food or medicine.  Medicines taken, including vitamins, herbs, eyedrops, over-the-counter medicines, and creams.  Use of steroids (by mouth or creams).  Previous problems with anesthetics or numbing medicines.  History of bleeding problems or blood  clots.  Previous surgery.  Other health problems, including diabetes and kidney problems.  Possibility of pregnancy, if this applies. RISKS AND COMPLICATIONS  Some problems that may happen after this procedure include:  Infection.  Damage to the nerves, arteries or tendons could occur. This would be very uncommon.  Bleeding. BEFORE THE PROCEDURE   This surgery may be done while you are asleep (general anesthetic) or may be done under a block where only your forearm and the surgical area is numb.  If the surgery is done under a block, the numbness will gradually wear off within several hours after surgery. HOME CARE INSTRUCTIONS   Have a responsible person with you for 24 hours.  Do not drive a car or use public transportation for 24 hours.  Only take over-the-counter or prescription medicines for pain, discomfort, or fever as directed by your caregiver. Take them as directed.  You may put ice on the palm side of the affected wrist.  Put ice in a plastic bag.  Place a towel between your skin and the bag.  Leave the ice on for 20 to 30 minutes, 4 times per day.  If you were given a splint to keep your wrist from bending, use it as directed. It is important to wear the splint at night or as directed. Use the splint for  as long as you have pain or numbness in your hand, arm, or wrist. This may take 1 to 2 months.  Keep your hand raised (elevated) above the level of your heart as much as possible. This keeps swelling down and helps with discomfort.  Change bandages (dressings) as directed.  Keep the wound clean and dry. SEEK MEDICAL CARE IF:   You develop pain not relieved with medications.  You develop numbness of your hand.  You develop bleeding from your surgical site.  You have an oral temperature above 102 F (38.9 C).  You develop redness or swelling of the surgical site.  You develop new, unexplained problems. SEEK IMMEDIATE MEDICAL CARE IF:   You develop  a rash.  You have difficulty breathing.  You develop any reaction or side effects to medications given. Document Released: 12/28/2003 Document Revised: 12/30/2011 Document Reviewed: 08/13/2007 [facility] Patient Information 2014 [place]. PATIENT INSTRUCTIONS POST-ANESTHESIA  IMMEDIATELY FOLLOWING SURGERY:  Do not drive or operate machinery for the first twenty four hours after surgery.  Do not make any important decisions for twenty four hours after surgery or while taking narcotic pain medications or sedatives.  If you develop intractable nausea and vomiting or a severe headache please notify your doctor immediately.  FOLLOW-UP:  Please make an appointment with your surgeon as instructed. You do not need to follow up with anesthesia unless specifically instructed to do so.  WOUND CARE INSTRUCTIONS (if applicable):  Keep a dry clean dressing on the anesthesia/puncture wound site if there is drainage.  Once the wound has quit draining you may leave it open to air.  Generally you should leave the bandage intact for twenty four hours unless there is drainage.  If the epidural site drains for more than 36-48 hours please call the anesthesia department.  QUESTIONS?:  Please feel free to call your physician or the hospital operator if you have any questions, and they will be happy to assist you.

## 2014-04-01 ENCOUNTER — Other Ambulatory Visit (HOSPITAL_COMMUNITY): Payer: BC Managed Care – PPO

## 2014-04-06 NOTE — H&P (Signed)
Krista Fitzgerald is an 48 y.o. female.   Chief Complaint: Carpal tunnel right HPI: She had carpal tunnel surgery on the left many years ago.  She was told she would need it on the right but has kept putting it off until now.  She has night paresthesias and pain.  She has tried various conservative treatments with no help.  She is getting worse. She would like to have surgery on the right wrist now. She is familiar with the procedure and risks and imponderables of this elective procedure.  Past Medical History  Diagnosis Date  . Anxiety     Past Surgical History  Procedure Laterality Date  . Tubal ligation    . Cyst removed from lower back    . Abdominal hysterectomy      partial  . Tonsillectomy    . Cholecystectomy    . Carpal tunnel release    . Back surgery      Family History  Problem Relation Age of Onset  . Diabetes Brother   . Hyperlipidemia Other   . Hypertension Other   . Heart disease Other   . Stroke Other    Social History:  reports that she has never smoked. She does not have any smokeless tobacco history on file. She reports that she uses illicit drugs (Marijuana). She reports that she does not drink alcohol.  Allergies:  Allergies  Allergen Reactions  . Penicillins Anaphylaxis    No prescriptions prior to admission    No results found for this or any previous visit (from the past 48 hour(s)). No results found.  Review of Systems  Musculoskeletal: Positive for myalgias (pain in the right wrist for some time.  She had left carpal tunnel release years ago and kept putting off surgery on the right wrist.).    There were no vitals taken for this visit. Physical Exam  Constitutional: She is oriented to person, place, and time. She appears well-developed and well-nourished.  HENT:  Head: Normocephalic and atraumatic.  Eyes: Conjunctivae and EOM are normal. Pupils are equal, round, and reactive to light.  Neck: Normal range of motion. Neck supple.   Cardiovascular: Normal rate, regular rhythm, normal heart sounds and intact distal pulses.   Respiratory: Effort normal and breath sounds normal.  GI: Soft. Bowel sounds are normal.  Musculoskeletal: She exhibits tenderness (pain of right wrist ventrally with positive Phalen and Tinel sign. Old healed scar of left wrist.).  Neurological: She is alert and oriented to person, place, and time. She has normal reflexes.  Skin: Skin is warm and dry.  Psychiatric: She has a normal mood and affect. Her behavior is normal. Judgment and thought content normal.     Assessment/Plan Carpal tunnel right.  For surgical release as outpatient.  KEELING,WAYNE 04/06/2014, 1:46 PM

## 2014-04-07 ENCOUNTER — Encounter (HOSPITAL_COMMUNITY): Payer: Self-pay | Admitting: *Deleted

## 2014-04-07 ENCOUNTER — Encounter (HOSPITAL_COMMUNITY): Payer: BC Managed Care – PPO | Admitting: Anesthesiology

## 2014-04-07 ENCOUNTER — Encounter (HOSPITAL_COMMUNITY)
Admission: RE | Disposition: A | Discharge status: Home / Self Care | Payer: Self-pay | Source: Ambulatory Visit | Attending: Orthopaedic Surgery

## 2014-04-07 ENCOUNTER — Ambulatory Visit (HOSPITAL_COMMUNITY): Payer: BC Managed Care – PPO | Admitting: Anesthesiology

## 2014-04-07 ENCOUNTER — Ambulatory Visit (HOSPITAL_COMMUNITY)
Admission: RE | Admit: 2014-04-07 | Discharge: 2014-04-07 | Disposition: A | Discharge status: Home / Self Care | Payer: BC Managed Care – PPO | Source: Ambulatory Visit | Attending: Orthopaedic Surgery | Admitting: Orthopaedic Surgery

## 2014-04-07 DIAGNOSIS — F411 Generalized anxiety disorder: Secondary | ICD-10-CM | POA: Insufficient documentation

## 2014-04-07 DIAGNOSIS — G5600 Carpal tunnel syndrome, unspecified upper limb: Secondary | ICD-10-CM | POA: Insufficient documentation

## 2014-04-07 HISTORY — PX: CARPAL TUNNEL RELEASE: SHX101

## 2014-04-07 LAB — GLUCOSE, CAPILLARY: Glucose-Capillary: 100 mg/dL — ABNORMAL HIGH (ref 70–99)

## 2014-04-07 SURGERY — CARPAL TUNNEL RELEASE
Anesthesia: Monitor Anesthesia Care | Site: Hand | Laterality: Right

## 2014-04-07 MED ORDER — FENTANYL CITRATE 0.05 MG/ML IJ SOLN: 25.0000 ug | INTRAMUSCULAR | Status: DC | PRN

## 2014-04-07 MED ORDER — SODIUM CHLORIDE 0.9 % IJ SOLN
INTRAMUSCULAR | Status: AC
  Filled 2014-04-07: qty 10

## 2014-04-07 MED ORDER — CHLORHEXIDINE GLUCONATE 4 % EX LIQD: 60.0000 mL | Freq: Once | CUTANEOUS | Status: DC

## 2014-04-07 MED ORDER — SODIUM CHLORIDE 0.9 % IR SOLN
Status: DC | PRN
  Administered 2014-04-07: 1000 mL

## 2014-04-07 MED ORDER — SODIUM CHLORIDE 0.9 % IJ SOLN
10.0000 mL | Freq: Once | INTRAMUSCULAR | Status: AC
  Administered 2014-04-07: 10 mL via INTRAVENOUS

## 2014-04-07 MED ORDER — FENTANYL CITRATE 0.05 MG/ML IJ SOLN
25.0000 ug | INTRAMUSCULAR | Status: AC
  Administered 2014-04-07 (×2): 25 ug via INTRAVENOUS
  Filled 2014-04-07: qty 2

## 2014-04-07 MED ORDER — MIDAZOLAM HCL 2 MG/2ML IJ SOLN
1.0000 mg | INTRAMUSCULAR | Status: DC | PRN
  Administered 2014-04-07: 2 mg via INTRAVENOUS

## 2014-04-07 MED ORDER — ONDANSETRON HCL 4 MG/2ML IJ SOLN: 4.0000 mg | Freq: Once | INTRAMUSCULAR | Status: DC | PRN

## 2014-04-07 MED ORDER — FENTANYL CITRATE 0.05 MG/ML IJ SOLN
INTRAMUSCULAR | Status: DC | PRN
  Administered 2014-04-07: 25 ug via INTRAVENOUS

## 2014-04-07 MED ORDER — LACTATED RINGERS IV SOLN
INTRAVENOUS | Status: DC
  Administered 2014-04-07: 07:00:00 via INTRAVENOUS

## 2014-04-07 MED ORDER — ONDANSETRON HCL 4 MG/2ML IJ SOLN
4.0000 mg | Freq: Once | INTRAMUSCULAR | Status: AC
  Administered 2014-04-07: 4 mg via INTRAVENOUS
  Filled 2014-04-07: qty 2

## 2014-04-07 MED ORDER — FENTANYL CITRATE 0.05 MG/ML IJ SOLN
INTRAMUSCULAR | Status: AC
  Filled 2014-04-07: qty 2

## 2014-04-07 MED ORDER — LIDOCAINE HCL (PF) 1 % IJ SOLN
INTRAMUSCULAR | Status: AC
  Filled 2014-04-07: qty 5

## 2014-04-07 MED ORDER — SODIUM CHLORIDE 0.9 % IJ SOLN
INTRAMUSCULAR | Status: DC | PRN
  Administered 2014-04-07: 1 mL

## 2014-04-07 MED ORDER — PROPOFOL INFUSION 10 MG/ML OPTIME
INTRAVENOUS | Status: DC | PRN
  Administered 2014-04-07: 30 ug/kg/min via INTRAVENOUS

## 2014-04-07 MED ORDER — LIDOCAINE HCL (PF) 0.5 % IJ SOLN
INTRAMUSCULAR | Status: DC | PRN
  Administered 2014-04-07: 250 mg via INTRAVENOUS

## 2014-04-07 MED ORDER — MIDAZOLAM HCL 2 MG/2ML IJ SOLN
INTRAMUSCULAR | Status: AC
  Filled 2014-04-07: qty 2

## 2014-04-07 MED ORDER — PROPOFOL 10 MG/ML IV BOLUS
INTRAVENOUS | Status: AC
  Filled 2014-04-07: qty 20

## 2014-04-07 SURGICAL SUPPLY — 41 items
BAG HAMPER (MISCELLANEOUS) ×3 IMPLANT
BANDAGE ELASTIC 3 VELCRO NS (GAUZE/BANDAGES/DRESSINGS) ×3 IMPLANT
BANDAGE ESMARK 4X12 BL STRL LF (DISPOSABLE) ×1 IMPLANT
BLADE 15 SAFETY STRL DISP (BLADE) ×3 IMPLANT
BNDG CMPR 12X4 ELC STRL LF (DISPOSABLE) ×1
BNDG ESMARK 4X12 BLUE STRL LF (DISPOSABLE) ×3
CLOTH BEACON ORANGE TIMEOUT ST (SAFETY) ×3 IMPLANT
COVER LIGHT HANDLE STERIS (MISCELLANEOUS) ×6 IMPLANT
CUFF TOURNIQUET SINGLE 18IN (TOURNIQUET CUFF) ×3 IMPLANT
DRSG XEROFORM 1X8 (GAUZE/BANDAGES/DRESSINGS) ×3 IMPLANT
DURAPREP 26ML APPLICATOR (WOUND CARE) ×3 IMPLANT
ELECT NEEDLE TIP 2.8 STRL (NEEDLE) IMPLANT
ELECT REM PT RETURN 9FT ADLT (ELECTROSURGICAL) ×3
ELECTRODE REM PT RTRN 9FT ADLT (ELECTROSURGICAL) ×1 IMPLANT
FORMALIN 10 PREFIL 120ML (MISCELLANEOUS) ×3 IMPLANT
GAUZE SPONGE 4X4 12PLY STRL (GAUZE/BANDAGES/DRESSINGS) ×2 IMPLANT
GLOVE BIO SURGEON STRL SZ8 (GLOVE) ×3 IMPLANT
GLOVE BIO SURGEON STRL SZ8.5 (GLOVE) ×3 IMPLANT
GLOVE BIOGEL PI IND STRL 7.0 (GLOVE) ×1 IMPLANT
GLOVE BIOGEL PI IND STRL 8.5 (GLOVE) ×1 IMPLANT
GLOVE BIOGEL PI INDICATOR 7.0 (GLOVE) ×2
GLOVE BIOGEL PI INDICATOR 8.5 (GLOVE) ×2
GLOVE OPTIFIT SS 6.5 STRL BRWN (GLOVE) ×3 IMPLANT
GLOVE OPTIFIT SS 8.5 STRL (GLOVE) ×3 IMPLANT
GOWN STRL REUS W/TWL LRG LVL3 (GOWN DISPOSABLE) ×3 IMPLANT
GOWN STRL REUS W/TWL XL LVL3 (GOWN DISPOSABLE) ×6 IMPLANT
KIT ROOM TURNOVER APOR (KITS) ×3 IMPLANT
NEEDLE HYPO 18GX1.5 BLUNT FILL (NEEDLE) ×3 IMPLANT
NEEDLE HYPO 27GX1-1/4 (NEEDLE) ×3 IMPLANT
NS IRRIG 1000ML POUR BTL (IV SOLUTION) ×3 IMPLANT
PACK BASIC LIMB (CUSTOM PROCEDURE TRAY) ×3 IMPLANT
PAD ARMBOARD 7.5X6 YLW CONV (MISCELLANEOUS) ×3 IMPLANT
PAD CAST 3X4 CTTN HI CHSV (CAST SUPPLIES) ×1 IMPLANT
PADDING CAST COTTON 3X4 STRL (CAST SUPPLIES) ×3
PADDING WEBRIL 3 STERILE (GAUZE/BANDAGES/DRESSINGS) ×3 IMPLANT
SET BASIN LINEN APH (SET/KITS/TRAYS/PACK) ×3 IMPLANT
SPONGE GAUZE 4X4 12PLY (GAUZE/BANDAGES/DRESSINGS) ×3 IMPLANT
SUT ETHILON 3 0 FSL (SUTURE) ×3 IMPLANT
SYR 3ML LL SCALE MARK (SYRINGE) ×3 IMPLANT
TOWEL OR 17X26 4PK STRL BLUE (TOWEL DISPOSABLE) ×3 IMPLANT
VESSEL LOOPS MAXI RED (MISCELLANEOUS) ×3 IMPLANT

## 2014-04-07 NOTE — Progress Notes (Signed)
The History and Physical is unchanged. I have examined the patient. The patient is medically able to have surgery on the right volar wrist. . KEELING,WAYNE 

## 2014-04-07 NOTE — Brief Op Note (Signed)
04/07/2014  8:10 AM  PATIENT:  Krista Fitzgerald  48 y.o. female  PRE-OPERATIVE DIAGNOSIS:  carpal tunnel syndrome right wrist  POST-OPERATIVE DIAGNOSIS:  carpal tunnel syndrome right wrist  PROCEDURE:  Procedure(s): CARPAL TUNNEL RELEASE (Right)  SURGEON:  Surgeon(s) and Role:    * Darreld McleanWayne Kelii Chittum, MD - Primary  PHYSICIAN ASSISTANT:   ASSISTANTS: none   ANESTHESIA:   regional  EBL:  Total I/O In: 600 [I.V.:600] Out: 0   BLOOD ADMINISTERED:none  DRAINS: none   LOCAL MEDICATIONS USED:  NONE  SPECIMEN:  Source of Specimen:  right volar carpal ligament  DISPOSITION OF SPECIMEN:  PATHOLOGY  COUNTS:  YES  TOURNIQUET:   Total Tourniquet Time Documented: Upper Arm (Right) - 31 minutes Total: Upper Arm (Right) - 31 minutes   DICTATION: .Other Dictation: Dictation Number [id number]  PLAN OF CARE: Discharge to home after PACU  PATIENT DISPOSITION:  PACU - hemodynamically stable.   Delay start of Pharmacological VTE agent (>24hrs) due to surgical blood loss or risk of bleeding: not applicable

## 2014-04-07 NOTE — Transfer of Care (Signed)
Immediate Anesthesia Transfer of Care Note  Patient: Krista CoriaMichelle Fitzgerald  Procedure(s) Performed: Procedure(s): CARPAL TUNNEL RELEASE (Right)  Patient Location: PACU  Anesthesia Type:MAC  Level of Consciousness: awake, alert  and oriented  Airway & Oxygen Therapy: Patient Spontanous Breathing  Post-op Assessment: Report given to PACU RN  Post vital signs: Reviewed  Complications: No apparent anesthesia complications

## 2014-04-07 NOTE — Anesthesia Preprocedure Evaluation (Signed)
Anesthesia Evaluation  Patient identified by MRN, date of birth, ID band Patient awake    Reviewed: Allergy & Precautions, H&P , NPO status , Patient's Chart, lab work & pertinent test results  History of Anesthesia Complications Negative for: history of anesthetic complications  Airway Mallampati: II TM Distance: >3 FB Neck ROM: Full    Dental  (+) Teeth Intact   Pulmonary neg pulmonary ROS,  breath sounds clear to auscultation        Cardiovascular negative cardio ROS  Rhythm:Regular Rate:Normal     Neuro/Psych PSYCHIATRIC DISORDERS Anxiety    GI/Hepatic negative GI ROS,   Endo/Other    Renal/GU      Musculoskeletal   Abdominal   Peds  Hematology   Anesthesia Other Findings   Reproductive/Obstetrics                           Anesthesia Physical Anesthesia Plan  ASA: II  Anesthesia Plan: MAC and Bier Block   Post-op Pain Management:    Induction: Intravenous  Airway Management Planned: Nasal Cannula  Additional Equipment:   Intra-op Plan:   Post-operative Plan:   Informed Consent: I have reviewed the patients History and Physical, chart, labs and discussed the procedure including the risks, benefits and alternatives for the proposed anesthesia with the patient or authorized representative who has indicated his/her understanding and acceptance.     Plan Discussed with:   Anesthesia Plan Comments:         Anesthesia Quick Evaluation

## 2014-04-07 NOTE — Anesthesia Postprocedure Evaluation (Signed)
  Anesthesia Post-op Note  Patient: Krista Fitzgerald  Procedure(s) Performed: Procedure(s): CARPAL TUNNEL RELEASE (Right)  Patient Location: PACU  Anesthesia Type:MAC  Level of Consciousness: awake, alert  and oriented  Airway and Oxygen Therapy: Patient Spontanous Breathing  Post-op Pain: none  Post-op Assessment: Post-op Vital signs reviewed, Patient's Cardiovascular Status Stable, Respiratory Function Stable, Patent Airway and No signs of Nausea or vomiting  Post-op Vital Signs: Reviewed and stable  Last Vitals:  Filed Vitals:   04/07/14 0700  BP: 114/69  Temp:   Resp: 15    Complications: No apparent anesthesia complications

## 2014-04-07 NOTE — Op Note (Signed)
NAMCarlene Coria:  GORDON, Krista Fitzgerald             ACCOUNT NO.:  [id number]  MEDICAL RECORD NO.:  [id number]  LOCATION:                                 FACILITY:  PHYSICIAN:  J. Darreld McleanWayne Keeling, M.D. DATE OF BIRTH:  1966/09/21  DATE OF PROCEDURE:  04/07/2014 DATE OF DISCHARGE:  04/07/2014                              OPERATIVE REPORT   PREOPERATIVE DIAGNOSIS:  Carpal tunnel syndrome, right.  POSTOPERATIVE DIAGNOSIS:  Carpal tunnel syndrome, right.  PROCEDURE:  Release of volar carpal ligament, saline neurolysis, aponeurotomy, right median nerve.  ANESTHESIA:  Regional Bier block.  TOURNIQUET TIME:  Thirty one minutes.  SURGEON:  J. Darreld McleanWayne Keeling, MD  DRAINS:  No drains.  SPLINT:  No splint.  INDICATIONS:  The patient's carpal tunnel syndrome, several months, not getting well.  She had a release on the other side years ago, and delayed having release on this side, but more recently she has carpal tunnel syndrome symptoms that return, rather significantly she is not getting better.  She has not improved with conservative treatment, surgery is now recommended.  She understands the risks and imponderables.  DESCRIPTION OF PROCEDURE:  The patient was seen in the holding area. The right hand was identified as correct surgical site and a mark was placed on the right wrist.  The patient was taken to the operating room, placed supine and given Bier block anesthesia, was hand table attached. She prepped and draped in usual manner.  A time-out identifying the patient, Ms. Roger ShelterGordon, we are doing a right hand for carpal tunnel release.  OR team knew each other.  All instrumentation properly positioned and working.  Outline for incision was made with  careful dissection, the median nerve was identified proximally and the vessel loop placed around the nerve. A grooved director was then used and then the volar carpal ligament was incised.  Specimen of volar carpal ligament was sent to Pathology.   The nerve was obviously compressed.  Retinaculum cut proximally.  Saline neurolysis carried out and then aponeurotomy carried out.  No apparent injury to the nerve.  The wound was reinspected, no apparent injury and the wounds were then reapproximated after removing the vessel loop using 3-0 nylon interrupted in vertical mattress manner.  Wounds were cleaned. Xeroform was applied and sterile dressing applied, bulky dressing applied.  Sheet cotton applied, sheet cotton cut dorsally.  ACE bandage applied loosely.  The patient tolerated procedure well, will go to recovery in good condition. Appropriate analgesic given for pain.  I will see her in my office in 1 week.          ______________________________ J. Darreld McleanWayne Keeling, M.D.     JWK/MEDQ  D:  04/07/2014  T:  04/07/2014  Job:  [id number]

## 2014-04-07 NOTE — Discharge Instructions (Signed)
Keep hand dry.  Keep dressing intact.  Do not remove dressing.  Elevate hand as needed.  Move fingers often.  Appointment to see Dr. Hilda LiasKeeling in one week.  Call [phone] if any problem or if after hours, the hospital at [phone].  Continue your present pain medicine.

## 2014-04-07 NOTE — Anesthesia Procedure Notes (Signed)
Anesthesia Regional Block:  Bier block (IV Regional)  Pre-Anesthetic Checklist: ,, timeout performed, Correct Patient, Correct Site, Correct Laterality, Correct Procedure,, site marked, surgical consent,, at surgeon's request Needles:  Injection technique: Single-shot  Needle Type: Other      Needle Gauge: 22 and 22 G    Additional Needles: Bier block (IV Regional)  Nerve Stimulator or Paresthesia:   Additional Responses:  Pulse checked post tourniquet inflation. IV NSL discontinued post injection. Narrative:   Performed by: Personally     

## 2014-04-08 ENCOUNTER — Encounter (HOSPITAL_COMMUNITY): Payer: Self-pay | Admitting: Orthopaedic Surgery

## 2014-12-14 DIAGNOSIS — F419 Anxiety disorder, unspecified: Secondary | ICD-10-CM | POA: Insufficient documentation

## 2014-12-19 DIAGNOSIS — Z980 Intestinal bypass and anastomosis status: Secondary | ICD-10-CM | POA: Insufficient documentation

## 2014-12-19 DIAGNOSIS — F1110 Opioid abuse, uncomplicated: Secondary | ICD-10-CM | POA: Insufficient documentation

## 2014-12-19 DIAGNOSIS — Z9884 Bariatric surgery status: Secondary | ICD-10-CM | POA: Insufficient documentation

## 2016-01-12 DIAGNOSIS — K269 Duodenal ulcer, unspecified as acute or chronic, without hemorrhage or perforation: Secondary | ICD-10-CM | POA: Insufficient documentation

## 2016-12-27 DIAGNOSIS — K219 Gastro-esophageal reflux disease without esophagitis: Secondary | ICD-10-CM | POA: Insufficient documentation

## 2020-03-21 DIAGNOSIS — M899 Disorder of bone, unspecified: Secondary | ICD-10-CM | POA: Insufficient documentation

## 2020-08-11 DIAGNOSIS — M5136 Other intervertebral disc degeneration, lumbar region: Secondary | ICD-10-CM | POA: Insufficient documentation

## 2020-08-30 DIAGNOSIS — E041 Nontoxic single thyroid nodule: Secondary | ICD-10-CM | POA: Insufficient documentation

## 2020-08-30 DIAGNOSIS — M5416 Radiculopathy, lumbar region: Secondary | ICD-10-CM | POA: Insufficient documentation

## 2021-08-10 DIAGNOSIS — M3589 Other specified systemic involvement of connective tissue: Secondary | ICD-10-CM | POA: Insufficient documentation

## 2021-10-02 ENCOUNTER — Encounter (HOSPITAL_COMMUNITY): Payer: Self-pay

## 2021-10-02 ENCOUNTER — Emergency Department
Admission: EM | Admit: 2021-10-02 | Discharge: 2021-10-02 | Discharge status: Against Medical Advice | Payer: Self-pay | Source: Home / Self Care

## 2021-10-02 ENCOUNTER — Emergency Department (HOSPITAL_COMMUNITY): Payer: Self-pay

## 2021-10-02 ENCOUNTER — Emergency Department (HOSPITAL_COMMUNITY)
Admission: EM | Admit: 2021-10-02 | Discharge: 2021-10-02 | Disposition: A | Discharge status: Home / Self Care | Payer: Self-pay | Attending: Emergency Medicine | Admitting: Emergency Medicine

## 2021-10-02 ENCOUNTER — Other Ambulatory Visit: Payer: Self-pay

## 2021-10-02 DIAGNOSIS — S42341A Displaced spiral fracture of shaft of humerus, right arm, initial encounter for closed fracture: Secondary | ICD-10-CM | POA: Insufficient documentation

## 2021-10-02 DIAGNOSIS — Z23 Encounter for immunization: Secondary | ICD-10-CM | POA: Insufficient documentation

## 2021-10-02 DIAGNOSIS — S42201A Unspecified fracture of upper end of right humerus, initial encounter for closed fracture: Secondary | ICD-10-CM

## 2021-10-02 DIAGNOSIS — W010XXA Fall on same level from slipping, tripping and stumbling without subsequent striking against object, initial encounter: Secondary | ICD-10-CM | POA: Insufficient documentation

## 2021-10-02 DIAGNOSIS — M25511 Pain in right shoulder: Secondary | ICD-10-CM | POA: Insufficient documentation

## 2021-10-02 DIAGNOSIS — Y9301 Activity, walking, marching and hiking: Secondary | ICD-10-CM | POA: Insufficient documentation

## 2021-10-02 DIAGNOSIS — S42331A Displaced oblique fracture of shaft of humerus, right arm, initial encounter for closed fracture: Secondary | ICD-10-CM | POA: Insufficient documentation

## 2021-10-02 MED ORDER — TETANUS-DIPHTH-ACELL PERTUSSIS 5-2.5-18.5 LF-MCG/0.5 IM SUSY
0.5000 mL | PREFILLED_SYRINGE | Freq: Once | INTRAMUSCULAR | Status: AC
  Administered 2021-10-02: 0.5 mL via INTRAMUSCULAR
  Filled 2021-10-02: qty 0.5

## 2021-10-02 MED ORDER — OXYCODONE-ACETAMINOPHEN 5-325 MG PO TABS: 1.0000 | ORAL_TABLET | Freq: Four times a day (QID) | ORAL | 0 refills | Status: DC | PRN

## 2021-10-02 MED ORDER — HYDROCODONE-ACETAMINOPHEN 5-325 MG PO TABS
1.0000 | ORAL_TABLET | Freq: Once | ORAL | Status: AC
  Administered 2021-10-02: 1 via ORAL
  Filled 2021-10-02: qty 1

## 2021-10-02 MED ORDER — HYDROMORPHONE HCL 1 MG/ML IJ SOLN
1.0000 mg | Freq: Once | INTRAMUSCULAR | Status: AC
  Administered 2021-10-02: 1 mg via INTRAMUSCULAR
  Filled 2021-10-02: qty 1

## 2021-10-02 NOTE — Discharge Instructions (Signed)
Call Dr. Mort Sawyers office today for an appointment in the next 2 days.  Wear the splint and sling as prescribed and do not use your right arm for anything.  Take the pain medication as prescribed.  Return to the ED with worsening pain, weakness, numbness, tingling, any other concerns

## 2021-10-02 NOTE — ED Triage Notes (Addendum)
Pov from home. Pt tripped in the dark over a round paver. Landed on her right shoulder. Hurts to move it. No LOC. Was at [facility] but was told she had a 12 hour wait so she brought herself here.  Took tylenol at 0130. Said she cannot take oral NSAIDs because of a wright loss surgery.

## 2021-10-02 NOTE — ED Provider Notes (Signed)
[facility] EMERGENCY DEPARTMENT Provider Note   CSN: [id number] Arrival date & time: 10/02/21  4098     History Chief Complaint  Patient presents with   Arm Injury    Krista Fitzgerald is a 55 y.o. female.  Patient states she fell while walking outside in the dark.  She tripped over a stone paver in the driveway and landed on her right shoulder.  Also scraped her left knee.  Denies hitting head or losing consciousness.  Fall happened about 130.  Did not hit her head or lose consciousness.  Denies any neck or back pain.  Denies any blood thinner use.  No other injury.  No chest pain or shortness of breath.  No abdominal pain. Unknown last tetanus. No focal weakness, numbness or tingling.  States she has a history of an unknown autoimmune disease as well as weak bones She first presented to [facility] but left without being seen  The history is provided by the patient.  Arm Injury Associated symptoms: no back pain, no fever and no neck pain       Past Medical History:  Diagnosis Date   Anxiety     There are no problems to display for this patient.   Past Surgical History:  Procedure Laterality Date   ABDOMINAL HYSTERECTOMY     partial   BACK SURGERY     CARPAL TUNNEL RELEASE     CARPAL TUNNEL RELEASE Right 04/07/2014   Procedure: CARPAL TUNNEL RELEASE;  Surgeon: Darreld Mclean, MD;  Location: [facility];  Service: Orthopedics;  Laterality: Right;   CHOLECYSTECTOMY     cyst removed from lower back     TONSILLECTOMY     TUBAL LIGATION       OB History   No obstetric history on file.     Family History  Problem Relation Age of Onset   Diabetes Brother    Hyperlipidemia Other    Hypertension Other    Heart disease Other    Stroke Other     Social History   Tobacco Use   Smoking status: Never  Substance Use Topics   Alcohol use: No   Drug use: Yes    Types: Marijuana    Home Medications Prior to Admission medications   Medication Sig Start Date End  Date Taking? Authorizing Provider  ALPRAZolam Prudy Feeler) 0.5 MG tablet Take 0.5 mg by mouth 3 (three) times daily.    [provider]  naproxen (NAPROSYN) 250 MG tablet Take 1 tablet (250 mg total) by mouth 2 (two) times daily with a meal. 11/16/12   Samuel Jester, DO  oxyCODONE-acetaminophen (PERCOCET) 7.5-325 MG per tablet Take 1 tablet by mouth 2 (two) times daily.    [provider]  pravastatin (PRAVACHOL) 40 MG tablet Take 40 mg by mouth at bedtime.    [provider]    Allergies    Penicillins  Review of Systems   Review of Systems  Constitutional:  Negative for activity change, appetite change and fever.  HENT:  Negative for congestion and rhinorrhea.   Respiratory:  Negative for cough, chest tightness and shortness of breath.   Cardiovascular:  Negative for chest pain.  Gastrointestinal:  Negative for abdominal pain, nausea and vomiting.  Genitourinary:  Negative for dysuria.  Musculoskeletal:  Positive for arthralgias and myalgias. Negative for back pain and neck pain.  Skin:  Negative for rash.  Neurological:  Negative for dizziness, weakness and headaches.   all other systems are negative except  as noted in the HPI and PMH.   Physical Exam Updated Vital Signs BP 114/82   Pulse 87   Temp 98 F (36.7 C)   Resp 20   Ht 5\' 3"  (1.6 m)   Wt 106.1 kg   SpO2 100%   BMI 41.43 kg/m   Physical Exam Vitals and nursing note reviewed.  Constitutional:      General: She is not in acute distress.    Appearance: She is well-developed.  HENT:     Head: Normocephalic and atraumatic.     Mouth/Throat:     Pharynx: No oropharyngeal exudate.  Eyes:     Conjunctiva/sclera: Conjunctivae normal.     Pupils: Pupils are equal, round, and reactive to light.  Neck:     Comments: No C spine tenderness Cardiovascular:     Rate and Rhythm: Normal rate and regular rhythm.     Heart sounds: Normal heart sounds. No murmur heard. Pulmonary:     Effort:  Pulmonary effort is normal. No respiratory distress.     Breath sounds: Normal breath sounds.  Abdominal:     Palpations: Abdomen is soft.     Tenderness: There is no abdominal tenderness. There is no guarding or rebound.  Musculoskeletal:        General: Swelling, tenderness, deformity and signs of injury present.     Cervical back: Normal range of motion and neck supple.     Comments: Diffuse tenderness to right anterior lateral shoulder and proximal humerus.  Questionable. deformity.  Intact radial pulse. Cardinal hand movements intact.  No pain to palpation of hand, wrist or elbow.  Abrasion L knee  Skin:    General: Skin is warm.  Neurological:     Mental Status: She is alert and oriented to person, place, and time.     Cranial Nerves: No cranial nerve deficit.     Motor: No abnormal muscle tone.     Coordination: Coordination normal.     Comments:  5/5 strength throughout. CN 2-12 intact.Equal grip strength.   Psychiatric:        Behavior: Behavior normal.    ED Results / Procedures / Treatments   Labs (all labs ordered are listed, but only abnormal results are displayed) Labs Reviewed - No data to display  EKG None  Radiology DG Shoulder Right  Result Date: 10/02/2021 CLINICAL DATA:  Fall injury with right shoulder pain. EXAM: RIGHT SHOULDER - 2+ VIEW COMPARISON:  None. FINDINGS: There is normal bone mineralization with acute spiral/oblique fracture of the proximal right humeral shaft, with a large nondisplaced butterfly comminution fragment at the lateral aspect extending up to the greater tuberosity and with the main distal fragment showing 1/2 of a shaft width medial displacement and mild abduction. The glenohumeral and AC joints are intact. No further fractures are visible. IMPRESSION: Spiral/oblique proximal right humeral shaft fracture, with the main distal fragment 1/2 shaft width medially displaced and mildly abducted and with a large nondisplaced butterfly  comminution fragment at the lateral aspect extending up to the greater tuberosity. Electronically Signed   By: 10/04/2021 M.D.   On: 10/02/2021 05:00   DG Humerus Right  Result Date: 10/02/2021 CLINICAL DATA:  55 year old female with history of trauma from a fall landing on to her right shoulder. Right arm pain. EXAM: RIGHT HUMERUS - 2+ VIEW COMPARISON:  No priors. FINDINGS: Acute displaced, minimally comminuted and angulated spiral type fracture of the proximal third of the right humeral diaphysis. There is a  proximally 35 degrees of medial angulation at the fracture. The proximal aspect of the fracture line extends toward the greater tuberosity of the proximal humerus. Tiny fracture fragment noted medially. Overlying soft tissues are grossly unremarkable. IMPRESSION: 1. Acute displaced, minimally comminuted and angulated spiral type fracture of the proximal third of the right humeral diaphysis, as above. Electronically Signed   By: Trudie Reed M.D.   On: 10/02/2021 05:14    Procedures Procedures   Medications Ordered in ED Medications  HYDROcodone-acetaminophen (NORCO/VICODIN) 5-325 MG per tablet 1 tablet (has no administration in time range)  Tdap (BOOSTRIX) injection 0.5 mL (has no administration in time range)    ED Course  I have reviewed the triage vital signs and the nursing notes.  Pertinent labs & imaging results that were available during my care of the patient were reviewed by me and considered in my medical decision making (see chart for details).    MDM Rules/Calculators/A&P                          Mechanical fall with right shoulder injury.  Neurovascular intact.  No head or neck trauma.  Xray with spiral comminuted proximal humerus fracture with angulation and butterfly fragment.   D/w Dr. Romeo Apple. He recommends immobilization in coaptation splint and sling, pain control and urgent office followup.   Patient instructed to call today for an appointment.  Narcotic database reviewed and short course of opiate medication provided.   Return precautions discussed.  Final Clinical Impression(s) / ED Diagnoses Final diagnoses:  Closed fracture of proximal end of right humerus, unspecified fracture morphology, initial encounter    Rx / DC Orders ED Discharge Orders     None        Artina Minella, Jeannett Senior, MD 10/02/21 [phone]

## 2021-10-03 ENCOUNTER — Ambulatory Visit (INDEPENDENT_AMBULATORY_CARE_PROVIDER_SITE_OTHER): Payer: Self-pay | Admitting: Orthopedic Surgery

## 2021-10-03 ENCOUNTER — Encounter (HOSPITAL_COMMUNITY): Payer: Self-pay

## 2021-10-03 ENCOUNTER — Other Ambulatory Visit: Payer: Self-pay

## 2021-10-03 ENCOUNTER — Encounter: Payer: Self-pay | Admitting: Orthopedic Surgery

## 2021-10-03 ENCOUNTER — Encounter (HOSPITAL_COMMUNITY)
Admission: RE | Admit: 2021-10-03 | Discharge: 2021-10-03 | Disposition: A | Discharge status: Home / Self Care | Payer: Medicaid Other | Source: Ambulatory Visit | Attending: Orthopedic Surgery | Admitting: Orthopedic Surgery

## 2021-10-03 VITALS — BP 119/85 | HR 80 | Ht 63.0 in | Wt 139.0 lb

## 2021-10-03 DIAGNOSIS — S42351A Displaced comminuted fracture of shaft of humerus, right arm, initial encounter for closed fracture: Secondary | ICD-10-CM | POA: Insufficient documentation

## 2021-10-03 DIAGNOSIS — Z01812 Encounter for preprocedural laboratory examination: Secondary | ICD-10-CM | POA: Insufficient documentation

## 2021-10-03 DIAGNOSIS — W19XXXA Unspecified fall, initial encounter: Secondary | ICD-10-CM

## 2021-10-03 DIAGNOSIS — X58XXXA Exposure to other specified factors, initial encounter: Secondary | ICD-10-CM | POA: Insufficient documentation

## 2021-10-03 HISTORY — DX: Age-related osteoporosis without current pathological fracture: M81.0

## 2021-10-03 LAB — BASIC METABOLIC PANEL
Anion gap: 11 (ref 5–15)
BUN: 10 mg/dL (ref 6–20)
CO2: 20 mmol/L — ABNORMAL LOW (ref 22–32)
Calcium: 8.8 mg/dL — ABNORMAL LOW (ref 8.9–10.3)
Chloride: 103 mmol/L (ref 98–111)
Creatinine, Ser: 0.77 mg/dL (ref 0.44–1.00)
GFR, Estimated: >60 mL/min (ref >60)
Glucose, Bld: 99 mg/dL (ref 70–99)
Potassium: 3.8 mmol/L (ref 3.5–5.1)
Sodium: 134 mmol/L — ABNORMAL LOW (ref 135–145)

## 2021-10-03 LAB — CBC WITH DIFFERENTIAL/PLATELET
Abs Immature Granulocytes: 0.02 10*3/uL (ref 0.00–0.07)
Basophils Absolute: 0 10*3/uL (ref 0.0–0.1)
Basophils Relative: 0 %
Eosinophils Absolute: 0 10*3/uL (ref 0.0–0.5)
Eosinophils Relative: 0 %
HCT: 40.5 % (ref 36.0–46.0)
Hemoglobin: 13.5 g/dL (ref 12.0–15.0)
Immature Granulocytes: 0 %
Lymphocytes Relative: 18 %
Lymphs Abs: 1.7 10*3/uL (ref 0.7–4.0)
MCH: 31.5 pg (ref 26.0–34.0)
MCHC: 33.3 g/dL (ref 30.0–36.0)
MCV: 94.6 fL (ref 80.0–100.0)
Monocytes Absolute: 0.6 10*3/uL (ref 0.1–1.0)
Monocytes Relative: 7 %
Neutro Abs: 7.2 10*3/uL (ref 1.7–7.7)
Neutrophils Relative %: 75 %
Platelets: 357 10*3/uL (ref 150–400)
RBC: 4.28 MIL/uL (ref 3.87–5.11)
RDW: 13.2 % (ref 11.5–15.5)
WBC: 9.5 10*3/uL (ref 4.0–10.5)
nRBC: 0 % (ref 0.0–0.2)

## 2021-10-03 NOTE — Patient Instructions (Signed)
BONNYE HALLE  10/03/2021     @PREFPERIOPPHARMACY @   Your procedure is scheduled on  10/05/2021.   Report to 10/07/2021 at  [place]  AM.    Call this number if you have problems the morning of surgery:  [phone]   Remember:  Do not eat or drink after midnight.      Take these medicines the morning of surgery with A SIP OF WATER         Xanax(if needed), oxycodone (if needed).     Do not wear jewelry, make-up or nail polish.  Do not wear lotions, powders, or perfumes, or deodorant.  Do not shave 48 hours prior to surgery.  Men may shave face and neck.  Do not bring valuables to the hospital.  [facility] is not responsible for any belongings or valuables.  Contacts, dentures or bridgework may not be worn into surgery.  Leave your suitcase in the car.  After surgery it may be brought to your room.  For patients admitted to the hospital, discharge time will be determined by your treatment team.  Patients discharged the day of surgery will not be allowed to drive home and must have someone with them for 24 hours.    Special instructions:  DO NOT smoke tobacco or vape for 24 hours before your procedure.  Please read over the following fact sheets that you were given. Coughing and Deep Breathing, Surgical Site Infection Prevention, Anesthesia Post-op Instructions, and Care and Recovery After Surgery       Closed Reduction for Wrist or Forearm A closed reduction for the wrist or forearm is a procedure to move the bones back into place after they are broken (fractured) or moved out of their normal position (dislocated). In this procedure, the health care provider moves the bones back into position by hand. This procedure is done without an incision or surgery. Your forearm is made up of two long bones called the radius and the ulna. These bones connect your forearm to your wrist. If a forearm bone is fractured on the end closest to the wrist joint, sometimes the  bones do not move very far out of place (stable fracture). You may have a closed reduction if the fractured or displaced bones of your wrist or forearm will stay stable with a cast or splint after they are moved back into their normal positions. Tell a health care provider about: Any allergies you have. All medicines you are taking, including vitamins, herbs, eye drops, creams, and over-the-counter medicines. Any problems you or family members have had with anesthetic medicines. Any blood disorders you have. Any surgeries you have had. Any medical conditions you have. Whether you are pregnant or may be pregnant. What are the risks? Generally, this is a safe procedure. However, problems may occur, including: Infection. Bleeding. Allergic reactions to medicines. Damage to nerves or blood vessels. Failure to heal. Continued pain or stiffness in the wrist. What happens before the procedure? Staying hydrated Follow instructions from your health care provider about hydration, which may include: Up to 2 hours before the procedure - you may continue to drink clear liquids, such as water, clear fruit juice, black coffee, and plain tea. Eating and drinking restrictions Follow instructions from your health care provider about eating and drinking, which may include: 8 hours before the procedure - stop eating heavy meals or foods, such as meat, fried foods, or fatty foods. 6 hours before the procedure -  stop eating light meals or foods, such as toast or cereal. 6 hours before the procedure - stop drinking milk or drinks that contain milk. 2 hours before the procedure - stop drinking clear liquids. Medicines Ask your health care provider about: Changing or stopping your regular medicines. This is especially important if you are taking diabetes medicines or blood thinners. Taking medicines such as aspirin and ibuprofen. These medicines can thin your blood. Do not take these medicines unless your  health care provider tells you to take them. Taking over-the-counter medicines, vitamins, herbs, and supplements. General instructions You may have a physical exam. The exam may include X-rays to find out more about your condition. Plan to have someone take you home from the hospital or clinic. If you will be going home right after the procedure, plan to have someone with you for 24 hours. Ask your health care provider what steps will be taken to help prevent infection. These may include washing your skin with a germ-killing soap. What happens during the procedure? An IV may be inserted into one of your veins. You may be given one or more of the following: A medicine to help you relax (sedative). A medicine to make you fall asleep (general anesthetic). A medicine that is injected into an area of your body to numb everything below the injection site (regional anesthetic). Your health care provider will move the broken bones back into their normal position. You will have more X-rays to make sure the bones are in the right position. You will have to wear a cast or splint to hold the bones in place while they heal. The procedure may vary among health care providers and hospitals. What happens after the procedure?  Your blood pressure, heart rate, breathing rate, and blood oxygen level will be monitored until you leave the hospital or clinic. Your arm and hand will be raised (elevated). You will be given medicine for pain as needed. Do not drive for 24 hours if you were given a sedative during your procedure. Summary A closed reduction for your wrist or forearm is used when you have broken or dislocated one or more bones in your arm. A closed reduction puts the bones back in their normal position without an incision or surgery. After the closed reduction procedure, your wrist or forearm will be placed in a splint or cast. This information is not intended to replace advice given to you by your  health care provider. Make sure you discuss any questions you have with your health care provider. Document Revised: 04/29/2019 Document Reviewed: 04/29/2019 Elsevier Patient Education  2022 [employer]. General Anesthesia, Adult, Care After This sheet gives you information about how to care for yourself after your procedure. Your health care provider may also give you more specific instructions. If you have problems or questions, contact your health care provider. What can I expect after the procedure? After the procedure, the following side effects are common: Pain or discomfort at the IV site. Nausea. Vomiting. Sore throat. Trouble concentrating. Feeling cold or chills. Feeling weak or tired. Sleepiness and fatigue. Soreness and body aches. These side effects can affect parts of the body that were not involved in surgery. Follow these instructions at home: For the time period you were told by your health care provider:  Rest. Do not participate in activities where you could fall or become injured. Do not drive or use machinery. Do not drink alcohol. Do not take sleeping pills or medicines that cause drowsiness.  Do not make important decisions or sign legal documents. Do not take care of children on your own. Eating and drinking Follow any instructions from your health care provider about eating or drinking restrictions. When you feel hungry, start by eating small amounts of foods that are soft and easy to digest (bland), such as toast. Gradually return to your regular diet. Drink enough fluid to keep your urine pale yellow. If you vomit, rehydrate by drinking water, juice, or clear broth. General instructions If you have sleep apnea, surgery and certain medicines can increase your risk for breathing problems. Follow instructions from your health care provider about wearing your sleep device: Anytime you are sleeping, including during daytime naps. While taking prescription pain  medicines, sleeping medicines, or medicines that make you drowsy. Have a responsible adult stay with you for the time you are told. It is important to have someone help care for you until you are awake and alert. Return to your normal activities as told by your health care provider. Ask your health care provider what activities are safe for you. Take over-the-counter and prescription medicines only as told by your health care provider. If you smoke, do not smoke without supervision. Keep all follow-up visits as told by your health care provider. This is important. Contact a health care provider if: You have nausea or vomiting that does not get better with medicine. You cannot eat or drink without vomiting. You have pain that does not get better with medicine. You are unable to pass urine. You develop a skin rash. You have a fever. You have redness around your IV site that gets worse. Get help right away if: You have difficulty breathing. You have chest pain. You have blood in your urine or stool, or you vomit blood. Summary After the procedure, it is common to have a sore throat or nausea. It is also common to feel tired. Have a responsible adult stay with you for the time you are told. It is important to have someone help care for you until you are awake and alert. When you feel hungry, start by eating small amounts of foods that are soft and easy to digest (bland), such as toast. Gradually return to your regular diet. Drink enough fluid to keep your urine pale yellow. Return to your normal activities as told by your health care provider. Ask your health care provider what activities are safe for you. This information is not intended to replace advice given to you by your health care provider. Make sure you discuss any questions you have with your health care provider. Document Revised: 06/22/2020 Document Reviewed: 01/20/2020 Elsevier Patient Education  2022 [employer]. How to Use  Chlorhexidine for Bathing Chlorhexidine gluconate (CHG) is a germ-killing (antiseptic) solution that is used to clean the skin. It can get rid of the bacteria that normally live on the skin and can keep them away for about 24 hours. To clean your skin with CHG, you may be given: A CHG solution to use in the shower or as part of a sponge bath. A prepackaged cloth that contains CHG. Cleaning your skin with CHG may help lower the risk for infection: While you are staying in the intensive care unit of the hospital. If you have a vascular access, such as a central line, to provide short-term or long-term access to your veins. If you have a catheter to drain urine from your bladder. If you are on a ventilator. A ventilator is a machine that  helps you breathe by moving air in and out of your lungs. After surgery. What are the risks? Risks of using CHG include: A skin reaction. Hearing loss, if CHG gets in your ears and you have a perforated eardrum. Eye injury, if CHG gets in your eyes and is not rinsed out. The CHG product catching fire. Make sure that you avoid smoking and flames after applying CHG to your skin. Do not use CHG: If you have a chlorhexidine allergy or have previously reacted to chlorhexidine. On babies younger than 69 months of age. How to use CHG solution Use CHG only as told by your health care provider, and follow the instructions on the label. Use the full amount of CHG as directed. Usually, this is one bottle. During a shower Follow these steps when using CHG solution during a shower (unless your health care provider gives you different instructions): Start the shower. Use your normal soap and shampoo to wash your face and hair. Turn off the shower or move out of the shower stream. Pour the CHG onto a clean washcloth. Do not use any type of brush or rough-edged sponge. Starting at your neck, lather your body down to your toes. Make sure you follow these instructions: If  you will be having surgery, pay special attention to the part of your body where you will be having surgery. Scrub this area for at least 1 minute. Do not use CHG on your head or face. If the solution gets into your ears or eyes, rinse them well with water. Avoid your genital area. Avoid any areas of skin that have broken skin, cuts, or scrapes. Scrub your back and under your arms. Make sure to wash skin folds. Let the lather sit on your skin for 1-2 minutes or as long as told by your health care provider. Thoroughly rinse your entire body in the shower. Make sure that all body creases and crevices are rinsed well. Dry off with a clean towel. Do not put any substances on your body afterward--such as powder, lotion, or perfume--unless you are told to do so by your health care provider. Only use lotions that are recommended by the manufacturer. Put on clean clothes or pajamas. If it is the night before your surgery, sleep in clean sheets.  During a sponge bath Follow these steps when using CHG solution during a sponge bath (unless your health care provider gives you different instructions): Use your normal soap and shampoo to wash your face and hair. Pour the CHG onto a clean washcloth. Starting at your neck, lather your body down to your toes. Make sure you follow these instructions: If you will be having surgery, pay special attention to the part of your body where you will be having surgery. Scrub this area for at least 1 minute. Do not use CHG on your head or face. If the solution gets into your ears or eyes, rinse them well with water. Avoid your genital area. Avoid any areas of skin that have broken skin, cuts, or scrapes. Scrub your back and under your arms. Make sure to wash skin folds. Let the lather sit on your skin for 1-2 minutes or as long as told by your health care provider. Using a different clean, wet washcloth, thoroughly rinse your entire body. Make sure that all body creases  and crevices are rinsed well. Dry off with a clean towel. Do not put any substances on your body afterward--such as powder, lotion, or perfume--unless you are told  to do so by your health care provider. Only use lotions that are recommended by the manufacturer. Put on clean clothes or pajamas. If it is the night before your surgery, sleep in clean sheets. How to use CHG prepackaged cloths Only use CHG cloths as told by your health care provider, and follow the instructions on the label. Use the CHG cloth on clean, dry skin. Do not use the CHG cloth on your head or face unless your health care provider tells you to. When washing with the CHG cloth: Avoid your genital area. Avoid any areas of skin that have broken skin, cuts, or scrapes. Before surgery Follow these steps when using a CHG cloth to clean before surgery (unless your health care provider gives you different instructions): Using the CHG cloth, vigorously scrub the part of your body where you will be having surgery. Scrub using a back-and-forth motion for 3 minutes. The area on your body should be completely wet with CHG when you are done scrubbing. Do not rinse. Discard the cloth and let the area air-dry. Do not put any substances on the area afterward, such as powder, lotion, or perfume. Put on clean clothes or pajamas. If it is the night before your surgery, sleep in clean sheets.  For general bathing Follow these steps when using CHG cloths for general bathing (unless your health care provider gives you different instructions). Use a separate CHG cloth for each area of your body. Make sure you wash between any folds of skin and between your fingers and toes. Wash your body in the following order, switching to a new cloth after each step: The front of your neck, shoulders, and chest. Both of your arms, under your arms, and your hands. Your stomach and groin area, avoiding the genitals. Your right leg and foot. Your left leg and  foot. The back of your neck, your back, and your buttocks. Do not rinse. Discard the cloth and let the area air-dry. Do not put any substances on your body afterward--such as powder, lotion, or perfume--unless you are told to do so by your health care provider. Only use lotions that are recommended by the manufacturer. Put on clean clothes or pajamas. Contact a health care provider if: Your skin gets irritated after scrubbing. You have questions about using your solution or cloth. You swallow any chlorhexidine. Call your local poison control center ([phone] in the [place] Get help right away if: Your eyes itch badly, or they become very red or swollen. Your skin itches badly and is red or swollen. Your hearing changes. You have trouble seeing. You have swelling or tingling in your mouth or throat. You have trouble breathing. These symptoms may represent a serious problem that is an emergency. Do not wait to see if the symptoms will go away. Get medical help right away. Call your local emergency services (911 in the [place] Do not drive yourself to the hospital. Summary Chlorhexidine gluconate (CHG) is a germ-killing (antiseptic) solution that is used to clean the skin. Cleaning your skin with CHG may help to lower your risk for infection. You may be given CHG to use for bathing. It may be in a bottle or in a prepackaged cloth to use on your skin. Carefully follow your health care provider's instructions and the instructions on the product label. Do not use CHG if you have a chlorhexidine allergy. Contact your health care provider if your skin gets irritated after scrubbing. This information is not intended to replace advice  given to you by your health care provider. Make sure you discuss any questions you have with your health care provider. Document Revised: 12/18/2020 Document Reviewed: 12/18/2020 Elsevier Patient Education  2022 [employer].

## 2021-10-03 NOTE — Progress Notes (Signed)
NEW PROBLEM//OFFICE VISIT   Chief Complaint  Patient presents with   Arm Injury    Right humerus fracture / fell yesterday 10/02/21   55yo female fell 12/13   Seen in ED for right arm pain   Sched for [facility] dec 24th   Moved here from [place] due to suspected bone cancer although w/u seems to indicate auto immune dx   She recently finished a course of steroids and Bactrim      ROS:   BP 119/85    Pulse 80    Ht 5\' 3"  (1.6 m)    Wt 139 lb (63 kg)    BMI 24.62 kg/m   Body mass index is 24.62 kg/m.  General appearance: Well-developed well-nourished no gross deformities  Cardiovascular normal pulse and perfusion normal color without edema  Neurologically no sensation loss or deficits or pathologic reflexes  Psychological: Awake alert and oriented x3 mood and affect normal  Skin no lacerations or ulcerations no nodularity no palpable masses, no erythema or nodularity  Musculoskeletal:  Right hand finger extension normal thumb extension normal wrist extension normal  Right shoulder tenderness some bruising on the skin   Review of Systems  Musculoskeletal:  Positive for back pain.  Neurological:  Negative for tingling and focal weakness.  All other systems reviewed and are negative.   Past Medical History:  Diagnosis Date   Anxiety     Past Surgical History:  Procedure Laterality Date   ABDOMINAL HYSTERECTOMY     partial   BACK SURGERY     CARPAL TUNNEL RELEASE     CARPAL TUNNEL RELEASE Right 04/07/2014   Procedure: CARPAL TUNNEL RELEASE;  Surgeon: 04/09/2014, MD;  Location: [facility];  Service: Orthopedics;  Laterality: Right;   CHOLECYSTECTOMY     cyst removed from lower back     TONSILLECTOMY     TUBAL LIGATION      Family History  Problem Relation Age of Onset   Diabetes Brother    Hyperlipidemia Other    Hypertension Other    Heart disease Other    Stroke Other    Social History   Tobacco Use   Smoking status: Never  Substance Use  Topics   Alcohol use: No   Drug use: Yes    Types: Marijuana    Allergies  Allergen Reactions   Penicillins Anaphylaxis   Nsaids     Contraindication to gastric bypass Gastric upset Contraindication to gastric bypass Gastric upset    Gabapentin     Contraindicated in gastric bypass Contraindicated in gastric bypass     Current Meds  Medication Sig   acetaminophen (TYLENOL) 500 MG tablet Take 2 tablets by mouth 2 (two) times daily.   ALPRAZolam (XANAX) 0.5 MG tablet Take 0.5 mg by mouth 3 (three) times daily.   Ascorbic Acid (VITAMIN C) 1000 MG tablet Take 1,000 mg by mouth daily.   Multiple Vitamin (MULTIVITAMIN WITH MINERALS) TABS tablet Take 1 tablet by mouth daily.   oxyCODONE-acetaminophen (PERCOCET/ROXICET) 5-325 MG tablet Take 1 tablet by mouth every 6 (six) hours as needed for severe pain.   sulfamethoxazole-trimethoprim (BACTRIM DS) 800-160 MG tablet Take 1 tablet by mouth 3 (three) times a week.   [DISCONTINUED] oxyCODONE-acetaminophen (PERCOCET) 7.5-325 MG per tablet Take 1 tablet by mouth 2 (two) times daily.     MEDICAL DECISION MAKING  A.  Encounter Diagnosis  Name Primary?   Displaced comminuted fracture of shaft of humerus, right arm, initial encounter for closed fracture  Yes    B. DATA ANALYSED:   IMAGING: Interpretation of images: I have personally reviewed the images and my interpretation is outside images from the emergency department she has a proximal midshaft humerus fracture with a long oblique fracture which extends also into the proximal humerus just below the humeral head with 35 degrees of apex medial angulation on 1 view  Orders: Surgery orders  Outside records reviewed: ED record   C. MANAGEMENT   Recommend close reduction splint application  The procedure has been fully reviewed with the patient; The risks and benefits of surgery have been discussed and explained and understood. Alternative treatment has also been reviewed,  questions were encouraged and answered. The postoperative plan is also been reviewed.   No orders of the defined types were placed in this encounter.    Fuller Canada, MD  10/03/2021 9:20 AM

## 2021-10-04 NOTE — H&P (Addendum)
Outpatient history and physical   Chief Complaint  Patient presents with   Arm Injury      Right humerus fracture / fell yesterday 10/02/21    55yo female fell 12/13    Seen in ED for right arm pain    Sched for cruise dec 24th    Moved here from [place] due to suspected bone cancer although w/u seems to indicate auto immune dx    She recently finished a course of steroids and Bactrim           ROS: neg x 10    BP 119/85    Pulse 80    Ht 5\' 3"  (1.6 m)    Wt 139 lb (63 kg)    BMI 24.62 kg/m    Body mass index is 24.62 kg/m.   General appearance: Well-developed well-nourished no gross deformities  Cardiovascular normal pulse and perfusion normal color without edema  Neurologically no sensation loss or deficits or pathologic reflexes   Psychological: Awake alert and oriented x3 mood and affect normal   Skin no lacerations or ulcerations no nodularity no palpable masses, no erythema or nodularity   Musculoskeletal:  Right hand finger extension normal thumb extension normal wrist extension normal   Right shoulder tenderness some bruising on the skin     Review of Systems  Musculoskeletal:  Positive for back pain.  Neurological:  Negative for tingling and focal weakness.  All other systems reviewed and are negative.         Past Medical History:  Diagnosis Date   Anxiety             Past Surgical History:  Procedure Laterality Date   ABDOMINAL HYSTERECTOMY        partial   BACK SURGERY       CARPAL TUNNEL RELEASE       CARPAL TUNNEL RELEASE Right 04/07/2014    Procedure: CARPAL TUNNEL RELEASE;  Surgeon: 04/09/2014, MD;  Location: [facility];  Service: Orthopedics;  Laterality: Right;   CHOLECYSTECTOMY       cyst removed from lower back       TONSILLECTOMY       TUBAL LIGATION               Family History  Problem Relation Age of Onset   Diabetes Brother     Hyperlipidemia Other     Hypertension Other     Heart disease Other     Stroke Other       Social History         Tobacco Use   Smoking status: Never  Substance Use Topics   Alcohol use: No   Drug use: Yes      Types: Marijuana           Allergies  Allergen Reactions   Penicillins Anaphylaxis   Nsaids        Contraindication to gastric bypass Gastric upset Contraindication to gastric bypass Gastric upset     Gabapentin        Contraindicated in gastric bypass Contraindicated in gastric bypass        Active Medications      Current Meds  Medication Sig   acetaminophen (TYLENOL) 500 MG tablet Take 2 tablets by mouth 2 (two) times daily.   ALPRAZolam (XANAX) 0.5 MG tablet Take 0.5 mg by mouth 3 (three) times daily.   Ascorbic Acid (VITAMIN C) 1000 MG tablet Take 1,000 mg by mouth daily.   Multiple  Vitamin (MULTIVITAMIN WITH MINERALS) TABS tablet Take 1 tablet by mouth daily.   oxyCODONE-acetaminophen (PERCOCET/ROXICET) 5-325 MG tablet Take 1 tablet by mouth every 6 (six) hours as needed for severe pain.   sulfamethoxazole-trimethoprim (BACTRIM DS) 800-160 MG tablet Take 1 tablet by mouth 3 (three) times a week.   [DISCONTINUED] oxyCODONE-acetaminophen (PERCOCET) 7.5-325 MG per tablet Take 1 tablet by mouth 2 (two) times daily.          MEDICAL DECISION MAKING   A.      Encounter Diagnosis  Name Primary?   Displaced comminuted fracture of shaft of humerus, right arm, initial encounter for closed fracture Yes      B. DATA ANALYSED:   IMAGING: Interpretation of images: I have personally reviewed the images and my interpretation is outside images from the emergency department she has a proximal midshaft humerus fracture with a long oblique fracture which extends also into the proximal humerus just below the humeral head with 35 degrees of apex medial angulation on 1 view  Orders: Surgery orders  Outside records reviewed: ED record    C. MANAGEMENT    Recommend close reduction splint application right humerus  The procedure has been fully  reviewed with the patient; The risks and benefits of surgery have been discussed and explained and understood. Alternative treatment has also been reviewed, questions were encouraged and answered. The postoperative plan is also been reviewed.     No orders of the defined types were placed in this encounter.       Fuller Canada, MD

## 2021-10-05 ENCOUNTER — Ambulatory Visit (HOSPITAL_COMMUNITY): Payer: Self-pay | Admitting: Anesthesiology

## 2021-10-05 ENCOUNTER — Ambulatory Visit (HOSPITAL_COMMUNITY)
Admission: RE | Admit: 2021-10-05 | Discharge: 2021-10-05 | Disposition: A | Discharge status: Home / Self Care | Payer: Self-pay | Attending: Orthopedic Surgery | Admitting: Orthopedic Surgery

## 2021-10-05 ENCOUNTER — Encounter (HOSPITAL_COMMUNITY): Payer: Self-pay | Admitting: Orthopedic Surgery

## 2021-10-05 ENCOUNTER — Encounter (HOSPITAL_COMMUNITY)
Admission: RE | Disposition: A | Discharge status: Home / Self Care | Payer: Self-pay | Source: Home / Self Care | Attending: Orthopedic Surgery

## 2021-10-05 ENCOUNTER — Ambulatory Visit (HOSPITAL_COMMUNITY): Payer: Self-pay

## 2021-10-05 DIAGNOSIS — S42351A Displaced comminuted fracture of shaft of humerus, right arm, initial encounter for closed fracture: Secondary | ICD-10-CM

## 2021-10-05 DIAGNOSIS — T148XXA Other injury of unspecified body region, initial encounter: Secondary | ICD-10-CM

## 2021-10-05 DIAGNOSIS — W19XXXA Unspecified fall, initial encounter: Secondary | ICD-10-CM | POA: Insufficient documentation

## 2021-10-05 HISTORY — PX: CLOSED REDUCTION HUMERUS FRACTURE: SHX985

## 2021-10-05 SURGERY — CLOSED REDUCTION, FRACTURE, HUMERUS, SHAFT
Anesthesia: General | Site: Arm Upper | Laterality: Right

## 2021-10-05 MED ORDER — PROPOFOL 10 MG/ML IV BOLUS
INTRAVENOUS | Status: DC | PRN
  Administered 2021-10-05: 150 mg via INTRAVENOUS

## 2021-10-05 MED ORDER — PROPOFOL 10 MG/ML IV BOLUS
INTRAVENOUS | Status: AC
  Filled 2021-10-05: qty 20

## 2021-10-05 MED ORDER — ONDANSETRON HCL 4 MG/2ML IJ SOLN: 4.0000 mg | Freq: Once | INTRAMUSCULAR | Status: DC | PRN

## 2021-10-05 MED ORDER — OXYCODONE-ACETAMINOPHEN 5-325 MG PO TABS: 1.0000 | ORAL_TABLET | ORAL | 0 refills | Status: DC | PRN

## 2021-10-05 MED ORDER — CHLORHEXIDINE GLUCONATE 0.12 % MT SOLN
15.0000 mL | Freq: Once | OROMUCOSAL | Status: AC
  Administered 2021-10-05: 15 mL via OROMUCOSAL

## 2021-10-05 MED ORDER — MIDAZOLAM HCL 2 MG/2ML IJ SOLN
INTRAMUSCULAR | Status: DC | PRN
  Administered 2021-10-05: 2 mg via INTRAVENOUS

## 2021-10-05 MED ORDER — ONDANSETRON HCL 4 MG/2ML IJ SOLN
INTRAMUSCULAR | Status: DC | PRN
  Administered 2021-10-05: 4 mg via INTRAVENOUS

## 2021-10-05 MED ORDER — OXYCODONE-ACETAMINOPHEN 5-325 MG PO TABS
1.0000 | ORAL_TABLET | Freq: Once | ORAL | Status: AC
  Administered 2021-10-05: 1 via ORAL

## 2021-10-05 MED ORDER — LACTATED RINGERS IV SOLN
INTRAVENOUS | Status: DC
  Administered 2021-10-05: 1000 mL via INTRAVENOUS

## 2021-10-05 MED ORDER — OXYCODONE-ACETAMINOPHEN 5-325 MG PO TABS
ORAL_TABLET | ORAL | Status: AC
  Filled 2021-10-05: qty 1

## 2021-10-05 MED ORDER — DEXAMETHASONE SODIUM PHOSPHATE 10 MG/ML IJ SOLN
INTRAMUSCULAR | Status: DC | PRN
  Administered 2021-10-05: 10 mg via INTRAVENOUS

## 2021-10-05 MED ORDER — ONDANSETRON HCL 4 MG/2ML IJ SOLN
INTRAMUSCULAR | Status: AC
  Filled 2021-10-05: qty 2

## 2021-10-05 MED ORDER — MEPERIDINE HCL 50 MG/ML IJ SOLN: 6.2500 mg | INTRAMUSCULAR | Status: DC | PRN

## 2021-10-05 MED ORDER — FENTANYL CITRATE (PF) 100 MCG/2ML IJ SOLN
INTRAMUSCULAR | Status: DC | PRN
  Administered 2021-10-05: 50 ug via INTRAVENOUS
  Administered 2021-10-05 (×2): 25 ug via INTRAVENOUS

## 2021-10-05 MED ORDER — DEXAMETHASONE SODIUM PHOSPHATE 10 MG/ML IJ SOLN
INTRAMUSCULAR | Status: AC
  Filled 2021-10-05: qty 1

## 2021-10-05 MED ORDER — ORAL CARE MOUTH RINSE: 15.0000 mL | Freq: Once | OROMUCOSAL | Status: AC

## 2021-10-05 MED ORDER — HYDROMORPHONE HCL 1 MG/ML IJ SOLN
0.2500 mg | INTRAMUSCULAR | Status: DC | PRN
  Administered 2021-10-05 (×2): 0.5 mg via INTRAVENOUS
  Filled 2021-10-05 (×2): qty 0.5

## 2021-10-05 MED ORDER — MIDAZOLAM HCL 2 MG/2ML IJ SOLN
INTRAMUSCULAR | Status: AC
  Filled 2021-10-05: qty 2

## 2021-10-05 MED ORDER — LIDOCAINE HCL (PF) 2 % IJ SOLN
INTRAMUSCULAR | Status: AC
  Filled 2021-10-05: qty 5

## 2021-10-05 MED ORDER — FENTANYL CITRATE (PF) 100 MCG/2ML IJ SOLN
INTRAMUSCULAR | Status: AC
  Filled 2021-10-05: qty 2

## 2021-10-05 MED ORDER — LIDOCAINE HCL (CARDIAC) PF 100 MG/5ML IV SOSY
PREFILLED_SYRINGE | INTRAVENOUS | Status: DC | PRN
  Administered 2021-10-05: 40 mg via INTRATRACHEAL

## 2021-10-05 SURGICAL SUPPLY — 5 items
BNDG ELASTIC 3X5.8 VLCR STR LF (GAUZE/BANDAGES/DRESSINGS) ×2 IMPLANT
BNDG ELASTIC 4X5.8 VLCR STR LF (GAUZE/BANDAGES/DRESSINGS) ×4 IMPLANT
KIT TURNOVER KIT A (KITS) ×3 IMPLANT
SPLINT FIBERGLASS 4X30 (CAST SUPPLIES) ×2 IMPLANT
WATER STERILE IRR 1000ML POUR (IV SOLUTION) ×4 IMPLANT

## 2021-10-05 NOTE — Anesthesia Preprocedure Evaluation (Signed)
Anesthesia Evaluation  Patient identified by MRN, date of birth, ID band Patient awake    Reviewed: Allergy & Precautions, NPO status , Patient's Chart, lab work & pertinent test results  Airway Mallampati: II  TM Distance: >3 FB Neck ROM: Full    Dental  (+) Dental Advisory Given, Missing, Poor Dentition   Pulmonary neg pulmonary ROS,    Pulmonary exam normal breath sounds clear to auscultation       Cardiovascular negative cardio ROS Normal cardiovascular exam Rhythm:Regular Rate:Normal     Neuro/Psych PSYCHIATRIC DISORDERS Anxiety  Neuromuscular disease    GI/Hepatic PUD, GERD  Medicated,(+)     substance abuse  marijuana use,   Endo/Other  negative endocrine ROS  Renal/GU negative Renal ROS  negative genitourinary   Musculoskeletal  (+) Arthritis , Osteoarthritis,  narcotic dependent  Abdominal   Peds negative pediatric ROS (+)  Hematology negative hematology ROS (+)   Anesthesia Other Findings FINDINGS: Acute displaced, minimally comminuted and angulated spiral type fracture of the proximal third of the right humeral diaphysis. There is a proximally 35 degrees of medial angulation at the fracture. The proximal aspect of the fracture line extends toward the greater tuberosity of the proximal humerus. Tiny fracture fragment noted medially. Overlying soft tissues are grossly unremarkable.  IMPRESSION: 1. Acute displaced, minimally comminuted and angulated spiral type fracture of the proximal third of the right humeral diaphysis, as above.   Electronically Signed   By: Trudie Reed M.D.   On: 10/02/2021 05:14   Reproductive/Obstetrics negative OB ROS                            Anesthesia Physical Anesthesia Plan  ASA: 2  Anesthesia Plan: General   Post-op Pain Management: Minimal or no pain anticipated   Induction:   PONV Risk Score and Plan: 4 or greater and  Ondansetron, Dexamethasone and Midazolam  Airway Management Planned: Nasal Cannula, Natural Airway, LMA and Simple Face Mask  Additional Equipment:   Intra-op Plan:   Post-operative Plan:   Informed Consent: I have reviewed the patients History and Physical, chart, labs and discussed the procedure including the risks, benefits and alternatives for the proposed anesthesia with the patient or authorized representative who has indicated his/her understanding and acceptance.     Dental advisory given  Plan Discussed with: CRNA and Surgeon  Anesthesia Plan Comments:         Anesthesia Quick Evaluation

## 2021-10-05 NOTE — Anesthesia Procedure Notes (Signed)
Procedure Name: LMA Insertion Date/Time: 10/05/2021 7:36 AM Performed by: Lorin Glass, CRNA Pre-anesthesia Checklist: Patient identified, Emergency Drugs available, Suction available and Patient being monitored Patient Re-evaluated:Patient Re-evaluated prior to induction Oxygen Delivery Method: Circle system utilized Preoxygenation: Pre-oxygenation with 100% oxygen Induction Type: IV induction LMA: LMA inserted LMA Size: 4.0 Number of attempts: 1 Placement Confirmation: positive ETCO2 and breath sounds checked- equal and bilateral Tube secured with: Tape Dental Injury: Teeth and Oropharynx as per pre-operative assessment

## 2021-10-05 NOTE — Op Note (Signed)
10/05/2021  8:04 AM  PATIENT:  Krista Fitzgerald  55 y.o. female  PRE-OPERATIVE DIAGNOSIS:  right humerus fracture  POST-OPERATIVE DIAGNOSIS:  right humerus fracture  PROCEDURE:  Procedure(s): CLOSED REDUCTION HUMERAL SHAFT (Right)  Findings at surgery valgus angulation or apex medial angulation proximal humerus fracture comminuted  Details of procedure patient was brought into the operating room general anesthesia she was placed in the Schlein positioner timeout was completed  Radiographs were obtained  Close reduction was performed  Reduction was adjusted with serial x-rays  Alignment was improved  Splint was applied and molded and final images showed improved position and correction of most of the apex medial angulation on the internal rotation view.  SURGEON:  Surgeon(s) and Role:    * Vickki Hearing, MD - Primary  PHYSICIAN ASSISTANT:   ASSISTANTS: none   ANESTHESIA:   general  EBL:  0 mL   BLOOD ADMINISTERED:none  DRAINS: none   LOCAL MEDICATIONS USED:  NONE  SPECIMEN:  No Specimen  DISPOSITION OF SPECIMEN:  N/A  COUNTS:  YES  TOURNIQUET:  * No tourniquets in log *  DICTATION: .Dragon Dictation  PLAN OF CARE: Discharge to home after PACU  PATIENT DISPOSITION:  PACU - hemodynamically stable.   Delay start of Pharmacological VTE agent (>24hrs) due to surgical blood loss or risk of bleeding: not applicable

## 2021-10-05 NOTE — Anesthesia Postprocedure Evaluation (Signed)
Anesthesia Post Note  Patient: Krista Fitzgerald  Procedure(s) Performed: CLOSED REDUCTION HUMERAL SHAFT (Right: Arm Upper)  Patient location during evaluation: Phase II Anesthesia Type: General Level of consciousness: awake and alert and oriented Pain management: pain level controlled Vital Signs Assessment: post-procedure vital signs reviewed and stable Respiratory status: spontaneous breathing, nonlabored ventilation and respiratory function stable Cardiovascular status: blood pressure returned to baseline and stable Postop Assessment: no apparent nausea or vomiting Anesthetic complications: no   No notable events documented.   Last Vitals:  Vitals:   10/05/21 0852 10/05/21 0901  BP: 114/60 112/70  Pulse: 91 81  Resp: 18 18  Temp:  36.7 C  SpO2: 96% 98%    Last Pain:  Vitals:   10/05/21 0901  TempSrc: Oral  PainSc: 5                  Adylene Dlugosz C Shahram Alexopoulos

## 2021-10-05 NOTE — Interval H&P Note (Signed)
History and Physical Interval Note:  10/05/2021 7:17 AM  Krista Fitzgerald  has presented today for surgery, with the diagnosis of right humerus fracture.  The various methods of treatment have been discussed with the patient and family. After consideration of risks, benefits and other options for treatment, the patient has consented to  Procedure(s): CLOSED REDUCTION HUMERAL SHAFT (Right) as a surgical intervention.  The patient's history has been reviewed, patient examined, no change in status, stable for surgery.  I have reviewed the patient's chart and labs.  Questions were answered to the patient's satisfaction.     Fuller Canada

## 2021-10-05 NOTE — Brief Op Note (Signed)
10/05/2021 ° °8:04 AM ° °PATIENT:  Chela C Wille  55 y.o. female ° °PRE-OPERATIVE DIAGNOSIS:  right humerus fracture ° °POST-OPERATIVE DIAGNOSIS:  right humerus fracture ° °PROCEDURE:  Procedure(s): °CLOSED REDUCTION HUMERAL SHAFT (Right) ° °Findings at surgery valgus angulation or apex medial angulation proximal humerus fracture comminuted ° °Details of procedure patient was brought into the operating room general anesthesia she was placed in the Schlein positioner timeout was completed ° °Radiographs were obtained ° °Close reduction was performed ° °Reduction was adjusted with serial x-rays ° °Alignment was improved ° °Splint was applied and molded and final images showed improved position and correction of most of the apex medial angulation on the internal rotation view. ° °SURGEON:  Surgeon(s) and Role: °   * Nox Talent E, MD - Primary ° °PHYSICIAN ASSISTANT:  ° °ASSISTANTS: none  ° °ANESTHESIA:   general ° °EBL:  0 mL  ° °BLOOD ADMINISTERED:none ° °DRAINS: none  ° °LOCAL MEDICATIONS USED:  NONE ° °SPECIMEN:  No Specimen ° °DISPOSITION OF SPECIMEN:  N/A ° °COUNTS:  YES ° °TOURNIQUET:  * No tourniquets in log * ° °DICTATION: .Dragon Dictation ° °PLAN OF CARE: Discharge to home after PACU ° °PATIENT DISPOSITION:  PACU - hemodynamically stable. °  °Delay start of Pharmacological VTE agent (>24hrs) due to surgical blood loss or risk of bleeding: not applicable °

## 2021-10-05 NOTE — Transfer of Care (Signed)
Immediate Anesthesia Transfer of Care Note  Patient: Krista Fitzgerald  Procedure(s) Performed: CLOSED REDUCTION HUMERAL SHAFT (Right: Arm Upper)  Patient Location: PACU  Anesthesia Type:General  Level of Consciousness: drowsy  Airway & Oxygen Therapy: Patient Spontanous Breathing and Patient connected to nasal cannula oxygen  Post-op Assessment: Report given to RN and Post -op Vital signs reviewed and stable  Post vital signs: Reviewed and stable  Last Vitals:  Vitals Value Taken Time  BP    Temp    Pulse 85 10/05/21 0809  Resp 19 10/05/21 0809  SpO2 99 % 10/05/21 0809  Vitals shown include unvalidated device data.  Last Pain:  Vitals:   10/05/21 0641  TempSrc: Oral  PainSc: 8       Patients Stated Pain Goal: 8 (10/05/21 0641)  Complications: No notable events documented.

## 2021-10-08 ENCOUNTER — Encounter (HOSPITAL_COMMUNITY): Payer: Self-pay | Admitting: Orthopedic Surgery

## 2021-10-09 ENCOUNTER — Telehealth: Payer: Self-pay | Admitting: Orthopedic Surgery

## 2021-10-09 NOTE — Telephone Encounter (Signed)
I called her to encourage ROM fingers Told her we expect swelling and motion does help  We will see her tomorrow To Dr Fran Lowes only

## 2021-10-09 NOTE — Telephone Encounter (Signed)
Spoke with patient; scheduled post operative visit for tomorrow, 10/10/21, with Dr Romeo Apple, post surgery 10/05/21; states her fingers are swollen. Please advise.

## 2021-10-10 ENCOUNTER — Ambulatory Visit: Payer: Medicaid Other

## 2021-10-10 ENCOUNTER — Other Ambulatory Visit: Payer: Self-pay

## 2021-10-10 ENCOUNTER — Ambulatory Visit (INDEPENDENT_AMBULATORY_CARE_PROVIDER_SITE_OTHER): Payer: Self-pay | Admitting: Orthopedic Surgery

## 2021-10-10 DIAGNOSIS — S42351D Displaced comminuted fracture of shaft of humerus, right arm, subsequent encounter for fracture with routine healing: Secondary | ICD-10-CM

## 2021-10-10 DIAGNOSIS — S42351A Displaced comminuted fracture of shaft of humerus, right arm, initial encounter for closed fracture: Secondary | ICD-10-CM

## 2021-10-10 MED ORDER — OXYCODONE-ACETAMINOPHEN 5-325 MG PO TABS: 1.0000 | ORAL_TABLET | ORAL | 0 refills | Status: AC | PRN

## 2021-10-10 MED ORDER — OXYCODONE-ACETAMINOPHEN 5-325 MG PO TABS: 1.0000 | ORAL_TABLET | ORAL | 0 refills | Status: DC | PRN

## 2021-10-10 NOTE — Progress Notes (Signed)
Postop visit #1 status post closed reduction of proximal humerus fracture  Surgery date October 05, 2021  Krista Fitzgerald had closed reduction of her shoulder.  We tried the patient in a fracture brace and took an x-ray today and basically lost some of the reduction however I think with gravity and the fracture cuff this should straighten itself out.  She is going on a trip so we thought the fracture brace would be easier for her to deal with on the trip than the coaptation splint  X-rays when she comes back  Meds ordered this encounter  Medications   oxyCODONE-acetaminophen (PERCOCET/ROXICET) 5-325 MG tablet    Sig: Take 1 tablet by mouth every 4 (four) hours as needed for up to 5 days for severe pain.    Dispense:  30 tablet    Refill:  0

## 2021-10-29 ENCOUNTER — Encounter: Payer: Self-pay | Admitting: Orthopedic Surgery

## 2021-10-29 ENCOUNTER — Ambulatory Visit: Payer: Medicaid Other

## 2021-10-29 ENCOUNTER — Other Ambulatory Visit: Payer: Self-pay

## 2021-10-29 ENCOUNTER — Ambulatory Visit (INDEPENDENT_AMBULATORY_CARE_PROVIDER_SITE_OTHER): Payer: Self-pay | Admitting: Orthopedic Surgery

## 2021-10-29 DIAGNOSIS — S42351D Displaced comminuted fracture of shaft of humerus, right arm, subsequent encounter for fracture with routine healing: Secondary | ICD-10-CM

## 2021-10-29 MED ORDER — HYDROCODONE-ACETAMINOPHEN 10-325 MG PO TABS: 1.0000 | ORAL_TABLET | ORAL | 0 refills | Status: AC | PRN

## 2021-10-29 NOTE — Progress Notes (Signed)
56 year old female follow-up for displaced right humerus fracture.  She had injury December 13 had closed reduction December 16 comes in for x-ray and a fracture brace  She complains of continued pain in the right arm  She tolerated her cruise okay  She will follow-up with [place] on the 30th for x-rays  Today's x-ray looked good some angulation but acceptable  Medication refilled but decreased  Meds ordered this encounter  Medications   HYDROcodone-acetaminophen (NORCO) 10-325 MG tablet    Sig: Take 1 tablet by mouth every 4 (four) hours as needed for up to 5 days.    Dispense:  30 tablet    Refill:  0

## 2021-11-19 ENCOUNTER — Ambulatory Visit (INDEPENDENT_AMBULATORY_CARE_PROVIDER_SITE_OTHER): Payer: Self-pay

## 2021-11-19 ENCOUNTER — Encounter: Payer: Self-pay | Admitting: Orthopedic Surgery

## 2021-11-19 ENCOUNTER — Other Ambulatory Visit: Payer: Self-pay

## 2021-11-19 ENCOUNTER — Ambulatory Visit (INDEPENDENT_AMBULATORY_CARE_PROVIDER_SITE_OTHER): Payer: Self-pay | Admitting: Orthopedic Surgery

## 2021-11-19 DIAGNOSIS — S42351D Displaced comminuted fracture of shaft of humerus, right arm, subsequent encounter for fracture with routine healing: Secondary | ICD-10-CM

## 2021-11-19 NOTE — Progress Notes (Signed)
Chief Complaint  Patient presents with   Arm Injury    Right humerus fracture 10/05/21   Encounter Diagnosis  Name Primary?   Closed displaced comminuted fracture of shaft of right humerus with routine healing, subsequent encounter  10/05/21 closed reduction  Yes    56 year old female with a right humerus fracture treated with closed reduction and bracing  Her pain is decreasing  She continues with an intact radial nerve  Her x-ray shows mild angulation with callus formation progressing  Patient will x-ray again in 5 weeks

## 2021-11-20 ENCOUNTER — Encounter: Payer: Self-pay | Admitting: Orthopedic Surgery

## 2021-11-21 ENCOUNTER — Ambulatory Visit: Payer: Medicaid Other

## 2021-11-21 ENCOUNTER — Ambulatory Visit (INDEPENDENT_AMBULATORY_CARE_PROVIDER_SITE_OTHER): Payer: Self-pay | Admitting: Orthopedic Surgery

## 2021-11-21 ENCOUNTER — Other Ambulatory Visit: Payer: Self-pay

## 2021-11-21 DIAGNOSIS — S42351D Displaced comminuted fracture of shaft of humerus, right arm, subsequent encounter for fracture with routine healing: Secondary | ICD-10-CM

## 2021-11-21 NOTE — Progress Notes (Signed)
PAIN RIGHT ARM AFTER FALL   56 year old female had closed reduction right proximal third humeral shaft fracture 10/05/2021  She was seen on 30 January fell on the 31st comes in with increased pain  She was wearing her sling and fracture cuff.  X-ray was taken and it shows no change in position of the fracture we can see her as scheduled

## 2021-12-24 ENCOUNTER — Ambulatory Visit: Payer: Medicaid Other

## 2021-12-24 ENCOUNTER — Other Ambulatory Visit: Payer: Self-pay

## 2021-12-24 ENCOUNTER — Ambulatory Visit (INDEPENDENT_AMBULATORY_CARE_PROVIDER_SITE_OTHER): Payer: Self-pay | Admitting: Orthopedic Surgery

## 2021-12-24 DIAGNOSIS — S42351D Displaced comminuted fracture of shaft of humerus, right arm, subsequent encounter for fracture with routine healing: Secondary | ICD-10-CM

## 2021-12-24 NOTE — Patient Instructions (Signed)
Physical therapy has been ordered for you at [facility]. They should call you to schedule, [phone] is the phone number to call, if you want to call to schedule.   ? ?Return in 3 months ?

## 2021-12-24 NOTE — Progress Notes (Signed)
Fracture care for postop follow-up ? ?Chief Complaint  ?Patient presents with  ? fracture care  ?  RT humerus// closed displaced comminuted fracture of shaft of right humerus ?DOS 10/05/21  ? ? ?Encounter Diagnosis  ?Name Primary?  ? Closed displaced comminuted fracture of shaft of right humerus with routine healing, subsequent encounter Yes  ? ? ?X-ray right proximal humerus injured around October 05, 2021 she underwent closed reduction she is almost out to the 12th week ? ?Today's x-ray shows the fracture is healing and alignment is good ? ?She does not have insurance but her abduction is only 45 degrees she needs to go to physical therapy with the occupational therapy department ? ?She can wean herself out of the humeral brace she should wear when out of the house and when doing any heavy activity ? ?Her next follow-up with me will be 3 months ? ?She may have to delay her mammogram a month or so until she can get her arm over her head but I advised her not to miss the mammogram this year last one was 2 years ago and she has a abnormal blood smear requiring treatment at [facility] ? ? ?

## 2022-01-03 ENCOUNTER — Other Ambulatory Visit: Payer: Self-pay

## 2022-01-03 ENCOUNTER — Encounter (HOSPITAL_COMMUNITY): Payer: Self-pay | Admitting: Occupational Therapy

## 2022-01-03 ENCOUNTER — Ambulatory Visit (HOSPITAL_COMMUNITY): Payer: Self-pay | Attending: Orthopedic Surgery | Admitting: Occupational Therapy

## 2022-01-03 DIAGNOSIS — R29898 Other symptoms and signs involving the musculoskeletal system: Secondary | ICD-10-CM | POA: Insufficient documentation

## 2022-01-03 DIAGNOSIS — M25611 Stiffness of right shoulder, not elsewhere classified: Secondary | ICD-10-CM | POA: Insufficient documentation

## 2022-01-03 DIAGNOSIS — M25511 Pain in right shoulder: Secondary | ICD-10-CM | POA: Insufficient documentation

## 2022-01-03 NOTE — Patient Instructions (Signed)
1) SHOULDER: Flexion On Table   Place hands on towel placed on table, elbows straight. Lean forward with you upper body, pushing towel away from body.  __10_ reps per set, __3_ sets per day  2) Abduction (Passive)   With arm out to side, resting on towel placed on table with palm DOWN, keeping trunk away from table, lean to the side while pushing towel away from body.  Repeat __10__ times. Do __3__ sessions per day.  Copyright  [employer]. All rights reserved.     3) Internal Rotation (Assistive)   Seated with elbow bent at right angle and held against side, slide arm on table surface in an inward arc keeping elbow anchored in place. Repeat __10__ times. Do ___3_ sessions per day. Activity: Use this motion to brush crumbs off the table.  Copyright  V[employer]. All rights reserved.   

## 2022-01-03 NOTE — Therapy (Signed)
?OUTPATIENT OCCUPATIONAL THERAPY SHOULDER EVALUATION ? ? ?Patient Name: Krista Fitzgerald ?MRN: [id number] ?DOB:01-23-1966, 56 y.o., female ?Today's Date: 01/03/2022 ? ? ? 01/03/22 1551  ?OT Visits / Re-Eval  ?Visit Number 1  ?Number of Visits 8  ?Date for OT Re-Evaluation 03/28/22  ?Authorization  ?Authorization Type Self-pay  ?OT Time Calculation  ?OT Start Time 1515  ?OT Stop Time 1538 ?(Pt requests to leave early to make another appt after OT)  ?OT Time Calculation (min) 23 min  ?End of Session  ?Activity Tolerance Patient tolerated treatment well  ?Behavior During Therapy [facility] for tasks assessed/performed  ? ? ?Past Medical History:  ?Diagnosis Date  ? Anxiety   ? Osteoporosis   ? ?Past Surgical History:  ?Procedure Laterality Date  ? ABDOMINAL HYSTERECTOMY    ? partial  ? BACK SURGERY    ? CARPAL TUNNEL RELEASE    ? CARPAL TUNNEL RELEASE Right 04/07/2014  ? Procedure: CARPAL TUNNEL RELEASE;  Surgeon: Darreld Mclean, MD;  Location: [facility];  Service: Orthopedics;  Laterality: Right;  ? CHOLECYSTECTOMY    ? CLOSED REDUCTION HUMERUS FRACTURE Right 10/05/2021  ? Procedure: CLOSED REDUCTION HUMERAL SHAFT;  Surgeon: Vickki Hearing, MD;  Location: [facility];  Service: Orthopedics;  Laterality: Right;  ? cyst removed from lower back    ? GASTRIC BYPASS    ? TONSILLECTOMY    ? TUBAL LIGATION    ? ?Patient Active Problem List  ? Diagnosis Date Noted  ? Closed displaced comminuted fracture of shaft of right humerus   ? 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 Lumbar radiculopathy 08/30/2020  ? Thyroid nodule 08/30/2020  ? Degeneration of lumbar intervertebral disc 08/11/2020  ? Disorder of bone 03/21/2020  ? Disorder of bone, unspecified 03/21/2020  ? Gastroesophageal reflux disease 12/27/2016  ? Duodenal ulcer, unspecified as acute or chronic, without hemorrhage or perforation 01/12/2016  ? 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 Intestinal bypass and anastomosis status  12/19/2014  ? Opioid abuse, uncomplicated (HCC) 12/19/2014  ? Anxiety 12/14/2014  ? Morbid (severe) obesity due to excess calories (HCC) 09/02/2014  ? Generalized anxiety disorder 10/22/1983  ? ? ?PCP: Dwana Melena, PA ? ?REFERRING PROVIDER: Vickki Hearing, MD ? ?REFERRING DIAG: s/p right proximal humerus fracture with closed reduction ? ?THERAPY DIAG:  ?Acute pain of right shoulder ? ?Stiffness of right shoulder, not elsewhere classified ? ?Other symptoms and signs involving the musculoskeletal system ? ? ?ONSET DATE: 10/02/21 ? ?SUBJECTIVE:                                                                                                                                                                                     ? ?  SUBJECTIVE STATEMENT: ?S: I fell over a water hose in the dark.  ? ?PERTINENT HISTORY: ?Pt is a 56 y/o female s/p right proximal humerus fx on 10/02/22 with closed reduction on 10/05/22. Pt has another fall on 11/19/21. Pt was referred to occupational therapy for evaluation and treatment by Dr. Fuller Canada.  ? ?PAIN:  ?Are you having pain? Yes: NPRS scale: 3/10 ?Pain location: right shoulder ?Pain description: aching/sore ?Aggravating factors: movement, weather ?Relieving factors: nothing ? ?PRECAUTIONS: None ? ?WEIGHT BEARING RESTRICTIONS No ? ?FALLS:  ?Has patient fallen in last 6 months? Yes Number of falls: 4 ? ? 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 ? ADLs: ?Pt is having significant difficulty with all ADLs-dressing, bathing, washing and fixing hair, reaching overhead and behind back. Pt has difficulty with sleep, meal preparation, caregiving tasks.  ? ?PLOF: Independent ? ?PATIENT GOALS To have less pain and be able to use my arm.  ? ?OBJECTIVE:  ? ? ?COGNITION: ? Overall cognitive status: Within functional limits for tasks assessed ?    ?SENSATION: ?WFL ? ?POSTURE: ?Rounded shoulders.  ? ?UPPER EXTREMITY ROM:  ? ?  ?Passive ROM Right ?01/03/2022 Left ?01/03/2022   ?Shoulder flexion 55   ?Shoulder abduction 54   ?Shoulder internal rotation 90   ?Shoulder external rotation 23   ?(Blank rows = not tested) ? ?   ?Active ROM Right ?01/03/2022 Left ?01/03/2022  ?Shoulder flexion 55   ?Shoulder abduction 50   ?Shoulder internal rotation 90   ?Shoulder external rotation 8   ?(Blank rows = not tested) ? ?UPPER EXTREMITY MMT: ? ?MMT Right ?01/03/2022 Left ?01/03/2022  ?Shoulder flexion 2/5   ?Shoulder abduction 2/5   ?Shoulder internal rotation 3/5   ?Shoulder external rotation 2/5   ?(Blank rows = not tested) ? ? ? ?PALPATION:  ?Mod to max fascial restrictions in right upper arm, trapezius, and scapular regions ?  ? ? ?PATIENT EDUCATION: ?Education details: table slides ?Person educated: Patient ?Education method: Explanation, Demonstration, and Handouts ?Education comprehension: verbalized understanding and returned demonstration ? ? ?HOME EXERCISE PROGRAM: ?1) SHOULDER: Flexion On Table ? ? ?Place hands on towel placed on table, elbows straight. Lean forward with you upper body, pushing towel away from body.  __10_ reps per set, __3_ sets per day ? ?2) Abduction (Passive) ? ? ?With arm out to side, resting on towel placed on table with palm DOWN, keeping trunk away from table, lean to the side while pushing towel away from body.  ?Repeat __10__ times. Do __3__ sessions per day. ? ?Copyright ? [employer]. All rights reserved.  ? ? ? ?3) Internal Rotation (Assistive) ? ? ?Seated with elbow bent at right angle and held against side, slide arm on table surface in an inward arc keeping elbow anchored in place. ?Repeat __10__ times. Do _3___ sessions per day. ?Activity: Use this motion to brush crumbs off the table. ? ?Copyright ? [employer]. All rights reserved.  ? ? ?ASSESSMENT: ? ?CLINICAL IMPRESSION: ?Patient is a 56 y.o. female presenting with significant functional deficits of the dominant RUE s/p right humerus fx and closed reduction on 10/15/22. Pt very pain limited with max guarding during  evaluation, cuing for relaxing arm to allow ROM. Pt is self-pay and is unable to attend regularly, is agreeable to try 1x every other week with HEP in between.  ? ? ?OBJECTIVE IMPAIRMENTS decreased activity tolerance, decreased endurance, decreased knowledge of use of DME, decreased ROM, decreased strength, increased fascial restrictions, increased muscle spasms, impaired UE functional  use, and pain.  ? ?ACTIVITY LIMITATIONS cleaning, community activity, driving, meal prep, laundry, yard work, and caregiving tasks .  ? ? ?REHAB POTENTIAL: Fair Pt with significant functional deficits, is unable to attend regularly due to finances.  ? ?CLINICAL DECISION MAKING: Stable/uncomplicated ? ?EVALUATION COMPLEXITY: Low ? ? ?GOALS: ?Goals reviewed with patient? Yes ? ?SHORT TERM GOALS: Target date: 02/13/2022 ? ?Pt will be provided with and educated on HEP to improve mobility of RUE required for use as dominant during ADL completion.  ?Goal status: INITIAL ? ?2.  Pt will [facility] to improve ability to complete dressing and bathing tasks with minimal compensatory strategies.  ?Baseline: <50% ROM ?Goal status: INITIAL ? ?3.  Pt will increase RUE strength to 3/5 or greater to improve ability to reach for items at waist to chest height during ADLs such as meal preparation and eating.   ?Baseline: 2/5 throughout ?Goal status: INITIAL ? ? ?LONG TERM GOALS: Target date: 03/28/2022 ? ?Pt will decrease pain in RUE to 3/10 or less to improve ability to sleep for 2+ consecutive hours without waking due to pain.  ?Baseline: waking often, high pain level ?Goal status: INITIAL ? ?2.  Pt will decrease RUE fascial restrictions to min amounts to improve mobility required for functional reaching tasks.  ?Baseline: mod to max fascial restrictions ?Goal status: INITIAL ? ?3.  Pt will increase RUE A/ROM to 50% ROM or greater to improve ability to perform bathing and dressing using AE as needed.   ?Baseline: ROM <50%  ?Goal status:  INITIAL ? ?4.  Pt will increase RUE strength to 4+/5 or greater to improve ability to perform caregiving tasks for mother-in-law.  ?Baseline: strength 2/5 throughout ?Goal status: INITIAL ? ? ?PLAN: ?PT FREQUENCY:

## 2022-01-15 ENCOUNTER — Encounter (HOSPITAL_COMMUNITY): Payer: Self-pay | Admitting: Occupational Therapy

## 2022-01-15 ENCOUNTER — Other Ambulatory Visit: Payer: Self-pay

## 2022-01-15 ENCOUNTER — Ambulatory Visit (HOSPITAL_COMMUNITY): Payer: Self-pay | Admitting: Occupational Therapy

## 2022-01-15 DIAGNOSIS — M25511 Pain in right shoulder: Secondary | ICD-10-CM

## 2022-01-15 DIAGNOSIS — R29898 Other symptoms and signs involving the musculoskeletal system: Secondary | ICD-10-CM

## 2022-01-15 DIAGNOSIS — M25611 Stiffness of right shoulder, not elsewhere classified: Secondary | ICD-10-CM

## 2022-01-15 NOTE — Patient Instructions (Signed)
1) Seated Row ? ? ?Sit up straight with elbows by your sides. Pull back with shoulders/elbows, keeping forearms straight, as if pulling back on the reins of a horse. Squeeze shoulder blades together. Repeat _10-15__times, __2-3__sets/day ? ? ? ?2) Shoulder Elevation ? ? ? ?Sit up straight with arms by your sides. Slowly bring your shoulders up towards your ears. Repeat_10-15__times, __2-3__ sets/day ? ? ? ?3) Shoulder Extension ? ? ? ?Sit up straight with both arms by your side, draw your arms back behind your waist. Keep your elbows straight. Repeat __10-15__times, __2-3__sets/day. ? ? ? ? ? ?Perform each exercise ____10-15____ reps. 2-3x days.  ? ?1) Protraction  ? ?Start by holding a wand or cane at chest height. ? ?Next, slowly push the wand outwards in front of your body so that your elbows become fully straightened. Then, return to the original position.  ? ? ? ?2) Shoulder FLEXION  ? ?In the standing position, hold a wand/cane with both arms, palms down on both sides. Raise up the wand/cane allowing your unaffected arm to perform most of the effort. Your affected arm should be partially relaxed.   ? ? ? ?3) Internal/External ROTATION  ? ?In the standing position, hold a wand/cane with both hands keeping your elbows bent. Move your arms and wand/cane to one side.  Your affected arm should be partially relaxed while your unaffected arm performs most of the effort.   ? ? ? ? ? ? ? ? ? ? ? ? ? ?

## 2022-01-15 NOTE — Therapy (Signed)
?OUTPATIENT OCCUPATIONAL THERAPY TREATMENT NOTE ? ? ?Patient Name: Krista Fitzgerald ?MRN: [id number] ?DOB:11/22/1965, 56 y.o., female ?Today's Date: 01/15/2022 ? ?PCP: Dwana MelenaScott, Russell B, PA ?REFERRING PROVIDER: Dr. Fuller CanadaStanley Harrison ? ? OT End of Session - 01/15/22 [id number]   ? ? Visit Number 2   ? Number of Visits 8   ? Date for OT Re-Evaluation 03/28/22   ? Authorization Type Self-pay   ? OT Start Time [phone]   ? OT Stop Time 0856   ? OT Time Calculation (min) 39 min   ? Activity Tolerance Patient tolerated treatment well   ? Behavior [facility] for tasks assessed/performed   ? ?  ?  ? ?  ? ? ?Past Medical History:  ?Diagnosis Date  ? Anxiety   ? Osteoporosis   ? ?Past Surgical History:  ?Procedure Laterality Date  ? ABDOMINAL HYSTERECTOMY    ? partial  ? BACK SURGERY    ? CARPAL TUNNEL RELEASE    ? CARPAL TUNNEL RELEASE Right 04/07/2014  ? Procedure: CARPAL TUNNEL RELEASE;  Surgeon: Darreld McleanWayne Keeling, MD;  Location: [facility];  Service: Orthopedics;  Laterality: Right;  ? CHOLECYSTECTOMY    ? CLOSED REDUCTION HUMERUS FRACTURE Right 10/05/2021  ? Procedure: CLOSED REDUCTION HUMERAL SHAFT;  Surgeon: Vickki HearingHarrison, Stanley E, MD;  Location: [facility];  Service: Orthopedics;  Laterality: Right;  ? cyst removed from lower back    ? GASTRIC BYPASS    ? TONSILLECTOMY    ? TUBAL LIGATION    ? ?Patient Active Problem List  ? Diagnosis Date Noted  ? Closed displaced comminuted fracture of shaft of right humerus   ? 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 Lumbar radiculopathy 08/30/2020  ? Thyroid nodule 08/30/2020  ? Degeneration of lumbar intervertebral disc 08/11/2020  ? Disorder of bone 03/21/2020  ? Disorder of bone, unspecified 03/21/2020  ? Gastroesophageal reflux disease 12/27/2016  ? Duodenal ulcer, unspecified as acute or chronic, without hemorrhage or perforation 01/12/2016  ? 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 Intestinal bypass and anastomosis status 12/19/2014  ? Opioid  abuse, uncomplicated (HCC) 12/19/2014  ? Anxiety 12/14/2014  ? Morbid (severe) obesity due to excess calories (HCC) 09/02/2014  ? Generalized anxiety disorder 10/22/1983  ? ? ?ONSET DATE: 10/02/21 ? ?REFERRING DIAG: s/p right proximal humerus fracture with closed reduction ? ?THERAPY DIAG:  ?Acute pain of right shoulder ? ?Stiffness of right shoulder, not elsewhere classified ? ?Other symptoms and signs involving the musculoskeletal system ? ? ?PERTINENT HISTORY: Pt is a 56 y/o female s/p right proximal humerus fx on 10/02/22 with closed reduction on 10/05/22. Pt has another fall on 11/19/21. Pt was referred to occupational therapy for evaluation and treatment by Dr. Fuller CanadaStanley Harrison.  ? ?PRECAUTIONS: None, progress as tolerated.  ? ?SUBJECTIVE: S: I've in the hot tub a lot.  ? ?PAIN:  ?Are you having pain? Yes: NPRS scale: 2/10 ?Pain location: right shoulder ?Pain description: sore ?Aggravating factors: movement ?Relieving factors: rest, hot tub ? ? ? ? ?OBJECTIVE:  ? ?UPPER EXTREMITY ROM:  ?  ?            ?Passive ROM Right ?01/03/2022 Left ?01/03/2022  ?Shoulder flexion 55    ?Shoulder abduction 54    ?Shoulder internal rotation 90    ?Shoulder external rotation 23    ?(Blank rows = not tested) ?  ?                       ?  Active ROM Right ?01/03/2022 Left ?01/03/2022  ?Shoulder flexion 55    ?Shoulder abduction 50    ?Shoulder internal rotation 90    ?Shoulder external rotation 8    ?(Blank rows = not tested) ?  ?UPPER EXTREMITY MMT: ?  ?MMT Right ?01/03/2022 Left ?01/03/2022  ?Shoulder flexion 2/5    ?Shoulder abduction 2/5    ?Shoulder internal rotation 3/5    ?Shoulder external rotation 2/5    ?(Blank rows = not tested) ? ? ?TODAY'S TREATMENT: ?-Myofascial release: completed separately from therapeutic exercises. Myofascial release to right upper arm, anterior shoulder, and trapezius regions to decrease pain and fascial restrictions an increase joint ROM.  ?-P/ROM: shoulder, all ranges, 10X each ?-AA/ROM: shoulder  protraction, er/IR 10X, flexion 5X ?-Scapular A/ROM-extension, row, scapular depression, 10X  ?-Therapy ball stretches: flexion, abduction, 10X each ? ? ? ? ?PATIENT EDUCATION: ?Education details: scapular A/ROM, AA/ROM protraction, flexion, er/IR while supine ?Person educated: patient ?Education method: Explanation, Demonstration, and Handouts ?Education comprehension: verbalized understanding and returned demonstration ? ? ?HOME EXERCISE PROGRAM: ?-eval: table slides; 3/28: scapular A/ROM, AA/ROM protraction, flexion, er/IR while supine ? ?  ?GOALS: ?Goals reviewed with patient? Yes ?  ?SHORT TERM GOALS: Target date: 02/13/2022 ?  ?Pt will be provided with and educated on HEP to improve mobility of RUE required for use as dominant during ADL completion.  ?Goal status: Ongoing ?  ?2.  Pt will increase RUE P/ROM to [facility] to improve ability to complete dressing and bathing tasks with minimal compensatory strategies.  ?Baseline: <50% ROM ?Goal status: Ongoing ?  ?3.  Pt will increase RUE strength to 3/5 or greater to improve ability to reach for items at waist to chest height during ADLs such as meal preparation and eating.   ?Baseline: 2/5 throughout ?Goal status: Ongoing ?  ?  ?LONG TERM GOALS: Target date: 03/28/2022 ?  ?Pt will decrease pain in RUE to 3/10 or less to improve ability to sleep for 2+ consecutive hours without waking due to pain.  ?Baseline: waking often, high pain level ?Goal status: Ongoing ?  ?2.  Pt will decrease RUE fascial restrictions to min amounts to improve mobility required for functional reaching tasks.  ?Baseline: mod to max fascial restrictions ?Goal status: Ongoing ?  ?3.  Pt will increase RUE A/ROM to 50% ROM or greater to improve ability to perform bathing and dressing using AE as needed.   ?Baseline: ROM <50%  ?Goal status: Ongoing ?  ?4.  Pt will increase RUE strength to 4+/5 or greater to improve ability to perform caregiving tasks for mother-in-law.  ?Baseline: strength 2/5  throughout ?Goal status: Ongoing ? ? ? ? ? ?   ?OT Assessment and Plan  ?Clinical Impression Statement A: Pt reports she has been using the hot tub a lot and her pain seems to be decreasing, she is not waking up crying now. Initiated myofascial release to address fascial restrictions in RUE. Pt tolerating approximately 50% ROM with passive stretching, initiated AA/ROM for shoulder flexion, protraction, and er/IR. Also completing scapular A/ROM and therapy ball stretches. Verbal cuing for form and technique.  ?Pt will benefit from skilled therapeutic intervention in order to improve on the following performance deficits Body Structure / Function / Physical Skills  ?Body Structure / Function / Physical Skills ADL;Endurance;Muscle spasms;UE functional use;Fascial restriction;Pain;ROM;IADL;Strength  ?Plan P: Follow up on HEP. continue working to increase P/ROM tolerance, continue progressing AA/ROM, add low level wall wash  ?Consulted and Agree with Plan of Care Patient  ? ? ? ?  Ezra Sites, OTR/L  ?[phone] ?01/15/2022, 8:57 AM ? ?  ? ? ? ?

## 2022-01-30 ENCOUNTER — Encounter (HOSPITAL_COMMUNITY): Payer: Self-pay

## 2022-01-30 ENCOUNTER — Ambulatory Visit (HOSPITAL_COMMUNITY): Payer: Self-pay | Attending: Orthopedic Surgery

## 2022-01-30 DIAGNOSIS — R29898 Other symptoms and signs involving the musculoskeletal system: Secondary | ICD-10-CM | POA: Insufficient documentation

## 2022-01-30 DIAGNOSIS — M25611 Stiffness of right shoulder, not elsewhere classified: Secondary | ICD-10-CM | POA: Insufficient documentation

## 2022-01-30 DIAGNOSIS — M25511 Pain in right shoulder: Secondary | ICD-10-CM | POA: Insufficient documentation

## 2022-01-30 NOTE — Patient Instructions (Signed)
SELF MASSAGE BALL - ANTERIOR SHOULDER ? ?Stand near the opening of a doorway or the corner of 2 walls so that your shoulder is lined up with the wall.  ? ?Place a lacrosse ball or tennis/racquetball on the front of your shoulder as shown.  ? ?Lean into the ball applying pressure to the area. You can hold this and perform small movements to roll the ball around the tight areas. Do not roll over bony areas.  ? ? ? ?SELF MASSAGE BALL - CROSS FRICTION BICEPS TENDON ? ?Sit in chair and place your arm on a table with palm directed upwards.  ? ?Place a lacrosse ball or tennis/racquetball on your arm at the inner elbow crease line.  ? ?Roll the ball side-to-side for a gentle massage.  ? ? ?

## 2022-01-30 NOTE — Therapy (Signed)
?OUTPATIENT OCCUPATIONAL THERAPY TREATMENT NOTE ? ? ?Patient Name: Lucienne CapersMichelle C Poblano ?MRN: [id number] ?DOB:[birth year], 56 y.o., female ?Today's Date: 01/30/2022 ? ?PCP: Dwana MelenaScott, Russell B, PA ?REFERRING PROVIDER: Dr. Fuller CanadaStanley Harrison ? ? OT End of Session - 01/30/22 0939   ? ? Visit Number 3   ? Number of Visits 8   ? Date for OT Re-Evaluation 03/28/22   ? Authorization Type Self-pay   ? OT Start Time 0900   ? OT Stop Time [id number]   ? OT Time Calculation (min) 38 min   ? Activity Tolerance Patient tolerated treatment well   ? Behavior [facility] for tasks assessed/performed   ? ?  ?  ? ?  ? ? ? ?Past Medical History:  ?Diagnosis Date  ? Anxiety   ? Osteoporosis   ? ?Past Surgical History:  ?Procedure Laterality Date  ? ABDOMINAL HYSTERECTOMY    ? partial  ? BACK SURGERY    ? CARPAL TUNNEL RELEASE    ? CARPAL TUNNEL RELEASE Right 04/07/2014  ? Procedure: CARPAL TUNNEL RELEASE;  Surgeon: Darreld McleanWayne Keeling, MD;  Location: [facility];  Service: Orthopedics;  Laterality: Right;  ? CHOLECYSTECTOMY    ? CLOSED REDUCTION HUMERUS FRACTURE Right 10/05/2021  ? Procedure: CLOSED REDUCTION HUMERAL SHAFT;  Surgeon: Vickki HearingHarrison, Stanley E, MD;  Location: [facility];  Service: Orthopedics;  Laterality: Right;  ? cyst removed from lower back    ? GASTRIC BYPASS    ? TONSILLECTOMY    ? TUBAL LIGATION    ? ?Patient Active Problem List  ? Diagnosis Date Noted  ? Closed displaced comminuted fracture of shaft of right humerus   ? 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 Lumbar radiculopathy 08/30/2020  ? Thyroid nodule 08/30/2020  ? Degeneration of lumbar intervertebral disc 08/11/2020  ? Disorder of bone 03/21/2020  ? Disorder of bone, unspecified 03/21/2020  ? Gastroesophageal reflux disease 12/27/2016  ? Duodenal ulcer, unspecified as acute or chronic, without hemorrhage or perforation 01/12/2016  ? 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 Intestinal bypass and anastomosis status 12/19/2014  ? Opioid  abuse, uncomplicated (HCC) 12/19/2014  ? Anxiety 12/14/2014  ? Morbid (severe) obesity due to excess calories (HCC) 09/02/2014  ? Generalized anxiety disorder 10/22/1983  ? ? ?ONSET DATE: 10/02/21 ? ?REFERRING DIAG: s/p right proximal humerus fracture with closed reduction ? ?THERAPY DIAG:  ?Acute pain of right shoulder ? ?Other symptoms and signs involving the musculoskeletal system ? ?Stiffness of right shoulder, not elsewhere classified ? ? ?PERTINENT HISTORY: Pt is a 56 y/o female s/p right proximal humerus fx on 10/02/22 with closed reduction on 10/05/22. Pt has another fall on 11/19/21. Pt was referred to occupational therapy for evaluation and treatment by Dr. Fuller CanadaStanley Harrison.  ? ?PRECAUTIONS: None, progress as tolerated.  ? ?SUBJECTIVE: S: It was an 8 this morning. ? ?PAIN:  ?Are you having pain? Yes: NPRS scale: 4/10 ?Pain location: right shoulder ?Pain description: sore ?Aggravating factors: movement ?Relieving factors: rest, hot tub ? ? ? ? ?OBJECTIVE:  ? ?UPPER EXTREMITY ROM:  ?  ?            ?Passive ROM Right ?01/03/2022  ?Shoulder flexion 55  ?Shoulder abduction 54  ?Shoulder internal rotation 90  ?Shoulder external rotation 23  ?(Blank rows = not tested) ?  ?                       ?Active ROM Right ?01/03/2022  ?  Shoulder flexion 55  ?Shoulder abduction 50  ?Shoulder internal rotation 90  ?Shoulder external rotation 8  ?(Blank rows = not tested) ?  ?UPPER EXTREMITY MMT: ?  ?MMT Right ?01/03/2022  ?Shoulder flexion 2/5  ?Shoulder abduction 2/5  ?Shoulder internal rotation 3/5  ?Shoulder external rotation 2/5  ?(Blank rows = not tested) ? ? ?TODAY'S TREATMENT: ?01/30/22 ?-Manual therapy: completed prior to exercises. Myofascial release, soft tissue mobilization, joint mobilization, and trigger point release completed to right elbow bicep tendon, upper arm/anterior shoulder region/bicep tendon, pectoralis, upper trapezius to decrease pain, fascial restrictions, trigger points, and muscle tightness.  ?-Muscle  energy technique completed to left shoulder during horizontal abduction to relax tone and increase ROM.  ?- P/ROM: supine, passive stretching and joint mobilization completed in conjunction with manual techniques, all shoulder ranges, IR/er completed adducted then gradually abducted, 5-10X ? ?01/15/22 ?-Myofascial release: completed separately from therapeutic exercises. Myofascial release to right upper arm, anterior shoulder, and trapezius regions to decrease pain and fascial restrictions an increase joint ROM.  ?-P/ROM: shoulder, all ranges, 10X each ?-AA/ROM: shoulder protraction, er/IR 10X, flexion 5X ?-Scapular A/ROM-extension, row, scapular depression, 10X  ?-Therapy ball stretches: flexion, abduction, 10X each ? ? ? ? ?PATIENT EDUCATION: ?Education details: self trigger point release to elbow and shoulder using tennis ball ?Person educated: patient ?Education method: demonstration, verbal cues, handout ?Education comprehension: verbalized and returned demonstration ? ? ?HOME EXERCISE PROGRAM: ?-eval: table slides; 3/28: scapular A/ROM, AA/ROM protraction, flexion, er/IR while supine ? ?  ?GOALS: ?Goals reviewed with patient? Yes ?  ?SHORT TERM GOALS: Target date: 02/13/2022 ?  ?Pt will be provided with and educated on HEP to improve mobility of RUE required for use as dominant during ADL completion.  ?Goal status: Ongoing ?  ?2.  Pt will increase RUE P/ROM to [facility] to improve ability to complete dressing and bathing tasks with minimal compensatory strategies.  ?Baseline: <50% ROM ?Goal status: Ongoing ?  ?3.  Pt will increase RUE strength to 3/5 or greater to improve ability to reach for items at waist to chest height during ADLs such as meal preparation and eating.   ?Baseline: 2/5 throughout ?Goal status: Ongoing ?  ?  ?LONG TERM GOALS: Target date: 03/28/2022 ?  ?Pt will decrease pain in RUE to 3/10 or less to improve ability to sleep for 2+ consecutive hours without waking due to pain.  ?Baseline: waking  often, high pain level ?Goal status: Ongoing ?  ?2.  Pt will decrease RUE fascial restrictions to min amounts to improve mobility required for functional reaching tasks.  ?Baseline: mod to max fascial restrictions ?Goal status: Ongoing ?  ?3.  Pt will increase RUE A/ROM to 50% ROM or greater to improve ability to perform bathing and dressing using AE as needed.   ?Baseline: ROM <50%  ?Goal status: Ongoing ?  ?4.  Pt will increase RUE strength to 4+/5 or greater to improve ability to perform caregiving tasks for mother-in-law.  ?Baseline: strength 2/5 throughout ?Goal status: Ongoing ? ? ? ? ? ?   ?OT Assessment and Plan  ?Clinical Impression Statement A:Focused session on manual techniques to address restrictions and increase joint mobility while decreasing pain level. Education provided throughout session regarding technique and provided handout to complete self trigger point release at home. Muscle energy technique was used with increased passive ROM noted with horizontal abduction. Verbal cuing for form and technique.  ?Pt will benefit from skilled therapeutic intervention in order to improve on the following performance deficits  Body Structure / Function / Physical Skills  ?Body Structure / Function / Physical Skills ADL;Endurance;Muscle spasms;UE functional use;Fascial restriction;Pain;ROM;IADL;Strength  ?Plan P: Follow up on use of tennis ball to complete self trigger point release. continue working to increase P/ROM tolerance with use of manual techniques as needed. Continue progressing AA/ROM, add wall wash and pulleys  ?Consulted and Agree with Plan of Care Patient  ? ? ? ?Limmie Patricia, OTR/L,CBIS  ?[phone] ? ?01/30/2022, 9:40 AM ? ?  ? ? ? ?

## 2022-02-13 ENCOUNTER — Encounter (HOSPITAL_COMMUNITY): Payer: Self-pay | Admitting: Occupational Therapy

## 2022-02-13 ENCOUNTER — Ambulatory Visit (HOSPITAL_COMMUNITY): Payer: Self-pay | Admitting: Occupational Therapy

## 2022-02-13 DIAGNOSIS — R29898 Other symptoms and signs involving the musculoskeletal system: Secondary | ICD-10-CM

## 2022-02-13 DIAGNOSIS — M25611 Stiffness of right shoulder, not elsewhere classified: Secondary | ICD-10-CM

## 2022-02-13 DIAGNOSIS — M25511 Pain in right shoulder: Secondary | ICD-10-CM

## 2022-02-13 NOTE — Patient Instructions (Signed)

## 2022-02-13 NOTE — Therapy (Signed)
?OUTPATIENT OCCUPATIONAL THERAPY TREATMENT NOTE ? ? ?Patient Name: Krista CapersMichelle C Fitzgerald ?MRN: [id number] ?DOB:1966-01-10, 56 y.o., female ?Today's Date: 02/13/2022 ? ?PCP: Dwana MelenaScott, Russell B, PA ?REFERRING PROVIDER: Dr. Fuller CanadaStanley Harrison ? ? OT End of Session - 02/13/22 0933   ? ? Visit Number 4   ? Number of Visits 8   ? Date for OT Re-Evaluation 03/28/22   ? Authorization Type Self-pay   ? OT Start Time [phone]   ? OT Stop Time [id number]   ? OT Time Calculation (min) 38 min   ? Activity Tolerance Patient tolerated treatment well   ? Behavior [facility] for tasks assessed/performed   ? ?  ?  ? ?  ? ? ? ? ?Past Medical History:  ?Diagnosis Date  ? Anxiety   ? Osteoporosis   ? ?Past Surgical History:  ?Procedure Laterality Date  ? ABDOMINAL HYSTERECTOMY    ? partial  ? BACK SURGERY    ? CARPAL TUNNEL RELEASE    ? CARPAL TUNNEL RELEASE Right 04/07/2014  ? Procedure: CARPAL TUNNEL RELEASE;  Surgeon: Darreld McleanWayne Keeling, MD;  Location: [facility];  Service: Orthopedics;  Laterality: Right;  ? CHOLECYSTECTOMY    ? CLOSED REDUCTION HUMERUS FRACTURE Right 10/05/2021  ? Procedure: CLOSED REDUCTION HUMERAL SHAFT;  Surgeon: Vickki HearingHarrison, Stanley E, MD;  Location: [facility];  Service: Orthopedics;  Laterality: Right;  ? cyst removed from lower back    ? GASTRIC BYPASS    ? TONSILLECTOMY    ? TUBAL LIGATION    ? ?Patient Active Problem List  ? Diagnosis Date Noted  ? Closed displaced comminuted fracture of shaft of right humerus   ? 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 Lumbar radiculopathy 08/30/2020  ? Thyroid nodule 08/30/2020  ? Degeneration of lumbar intervertebral disc 08/11/2020  ? Disorder of bone 03/21/2020  ? Disorder of bone, unspecified 03/21/2020  ? Gastroesophageal reflux disease 12/27/2016  ? Duodenal ulcer, unspecified as acute or chronic, without hemorrhage or perforation 01/12/2016  ? 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 Intestinal bypass and anastomosis status 12/19/2014  ? Opioid  abuse, uncomplicated (HCC) 12/19/2014  ? Anxiety 12/14/2014  ? Morbid (severe) obesity due to excess calories (HCC) 09/02/2014  ? Generalized anxiety disorder 10/22/1983  ? ? ?ONSET DATE: 10/02/21 ? ?REFERRING DIAG: s/p right proximal humerus fracture with closed reduction ? ?THERAPY DIAG:  ?Acute pain of right shoulder ? ?Other symptoms and signs involving the musculoskeletal system ? ?Stiffness of right shoulder, not elsewhere classified ? ? ?PERTINENT HISTORY: Pt is a 56 y/o female s/p right proximal humerus fx on 10/02/22 with closed reduction on 10/05/22. Pt has another fall on 11/19/21. Pt was referred to occupational therapy for evaluation and treatment by Dr. Fuller CanadaStanley Harrison.  ? ?PRECAUTIONS: None, progress as tolerated.  ? ?SUBJECTIVE: S: I can reach out a little bit.  ? ?PAIN:  ?Are you having pain? Yes: NPRS scale: 6/10 ?Pain location: right shoulder ?Pain description: sore ?Aggravating factors: movement ?Relieving factors: rest, hot tub ? ? ? ? ?OBJECTIVE:  ? ?UPPER EXTREMITY ROM:  ?  ?            ?Passive ROM Right ?01/03/2022  ?Shoulder flexion 55  ?Shoulder abduction 54  ?Shoulder internal rotation 90  ?Shoulder external rotation 23  ?(Blank rows = not tested) ?  ?                       ?Active ROM  Right ?01/03/2022  ?Shoulder flexion 55  ?Shoulder abduction 50  ?Shoulder internal rotation 90  ?Shoulder external rotation 8  ?(Blank rows = not tested) ?  ?UPPER EXTREMITY MMT: ?  ?MMT Right ?01/03/2022  ?Shoulder flexion 2/5  ?Shoulder abduction 2/5  ?Shoulder internal rotation 3/5  ?Shoulder external rotation 2/5  ?(Blank rows = not tested) ? ? ?TODAY'S TREATMENT: ?02/13/22 ?-Myofascial release: completed separately from therapeutic exercises. Myofascial release to right upper arm, anterior shoulder, and trapezius regions to decrease pain and fascial restrictions an increase joint ROM.  ?-P/ROM: shoulder, all ranges, 10X each ?-AA/ROM: protraction, flexion, horizontal abduction,  10X each,  supine ? ? ?01/30/22 ?-Manual therapy: completed prior to exercises. Myofascial release, soft tissue mobilization, joint mobilization, and trigger point release completed to right elbow bicep tendon, upper arm/anterior shoulder region/bicep tendon, pectoralis, upper trapezius to decrease pain, fascial restrictions, trigger points, and muscle tightness.  ?-Muscle energy technique completed to left shoulder during horizontal abduction to relax tone and increase ROM.  ?- P/ROM: supine, passive stretching and joint mobilization completed in conjunction with manual techniques, all shoulder ranges, IR/er completed adducted then gradually abducted, 5-10X ? ?01/15/22 ?-Myofascial release: completed separately from therapeutic exercises. Myofascial release to right upper arm, anterior shoulder, and trapezius regions to decrease pain and fascial restrictions an increase joint ROM.  ?-P/ROM: shoulder, all ranges, 10X each ?-AA/ROM: shoulder protraction, er/IR 10X, flexion 5X ?-Scapular A/ROM-extension, row, scapular depression, 10X  ?-Therapy ball stretches: flexion, abduction, 10X each ? ? ? ? ?PATIENT EDUCATION: ?Education details: continue with AA/ROM ?Person educated: patient ?Education method: demonstration, verbal cues, handout ?Education comprehension: verbalized and returned demonstration ? ? ?HOME EXERCISE PROGRAM: ?-eval: table slides; 3/28: scapular A/ROM, AA/ROM protraction, flexion, er/IR while supine ? ?  ?GOALS: ?Goals reviewed with patient? Yes ?  ?SHORT TERM GOALS: Target date: 02/13/2022 ?  ?Pt will be provided with and educated on HEP to improve mobility of RUE required for use as dominant during ADL completion.  ?Goal status: Ongoing ?  ?2.  Pt will increase RUE P/ROM to [facility] to improve ability to complete dressing and bathing tasks with minimal compensatory strategies.  ?Baseline: <50% ROM ?Goal status: Ongoing ?  ?3.  Pt will increase RUE strength to 3/5 or greater to improve ability to reach for items at  waist to chest height during ADLs such as meal preparation and eating.   ?Baseline: 2/5 throughout ?Goal status: Ongoing ?  ?  ?LONG TERM GOALS: Target date: 03/28/2022 ?  ?Pt will decrease pain in RUE to 3/10 or less to improve ability to sleep for 2+ consecutive hours without waking due to pain.  ?Baseline: waking often, high pain level ?Goal status: Ongoing ?  ?2.  Pt will decrease RUE fascial restrictions to min amounts to improve mobility required for functional reaching tasks.  ?Baseline: mod to max fascial restrictions ?Goal status: Ongoing ?  ?3.  Pt will increase RUE A/ROM to 50% ROM or greater to improve ability to perform bathing and dressing using AE as needed.   ?Baseline: ROM <50%  ?Goal status: Ongoing ?  ?4.  Pt will increase RUE strength to 4+/5 or greater to improve ability to perform caregiving tasks for mother-in-law.  ?Baseline: strength 2/5 throughout ?Goal status: Ongoing ? ? ? ? ? ?   ?OT Assessment and Plan  ?Clinical Impression Statement A: Pt reports she can reach out to the side a little more now and has been trying the tennis ball myofascial release techniques at home. Continued  with myofascial release, passive stretching today. Added AA/ROM in supine with exception of abduction. Verbal cuing for form and technique. Unable to add wall wash and pulleys due to time constraints.  ?Pt will benefit from skilled therapeutic intervention in order to improve on the following performance deficits Body Structure / Function / Physical Skills  ?Body Structure / Function / Physical Skills ADL;Endurance;Muscle spasms;UE functional use;Fascial restriction;Pain;ROM;IADL;Strength  ?Plan P: Follow up on HEP, Continue with AA/ROM tasks, add wall wash and pulleys  ?Consulted and Agree with Plan of Care Patient  ? ? ? ? ?Ezra Sites, OTR/L  ?[phone] ?02/13/2022, 9:43 AM ? ?  ? ? ? ?

## 2022-02-27 ENCOUNTER — Ambulatory Visit (HOSPITAL_COMMUNITY): Payer: Self-pay

## 2022-03-13 ENCOUNTER — Encounter (HOSPITAL_COMMUNITY): Payer: Medicaid Other | Admitting: Occupational Therapy

## 2022-03-25 ENCOUNTER — Ambulatory Visit (INDEPENDENT_AMBULATORY_CARE_PROVIDER_SITE_OTHER): Payer: Self-pay | Admitting: Orthopedic Surgery

## 2022-03-25 ENCOUNTER — Telehealth (HOSPITAL_COMMUNITY): Payer: Self-pay | Admitting: Occupational Therapy

## 2022-03-25 DIAGNOSIS — S42351D Displaced comminuted fracture of shaft of humerus, right arm, subsequent encounter for fracture with routine healing: Secondary | ICD-10-CM

## 2022-03-25 NOTE — Telephone Encounter (Signed)
Direct exposure to Covid patient wants to cx she doesn't want to take any chances.

## 2022-03-25 NOTE — Progress Notes (Signed)
Chief Complaint  Patient presents with   Follow-up    Recheck on right shoulder.    DOI 10/05/21  6 months post right hum frx treated with bracing and PT   Cant afford PT   Still stiff Active abd 80, flexion about 90   [facility]  Pool plus home exercises   F/u 6 months

## 2022-03-27 ENCOUNTER — Encounter (HOSPITAL_COMMUNITY): Payer: Medicaid Other | Admitting: Occupational Therapy

## 2022-04-08 ENCOUNTER — Telehealth (HOSPITAL_COMMUNITY): Payer: Self-pay | Admitting: Occupational Therapy

## 2022-04-08 NOTE — Telephone Encounter (Signed)
Patient wants to be d/c she will go to [place] where she gets financial assistance.

## 2022-04-10 ENCOUNTER — Encounter (HOSPITAL_COMMUNITY): Payer: Medicaid Other | Admitting: Occupational Therapy

## 2022-04-23 IMAGING — DX DG HUMERUS 2V *R*
6 series · 6 of 6 positions shown · non-contrast
Comparison: October 02, 2021.

CLINICAL DATA: Closed reduction of right humeral shaft fracture.

EXAM:
RIGHT HUMERUS - 2+ VIEW

[humerus ap (1 of 3)]
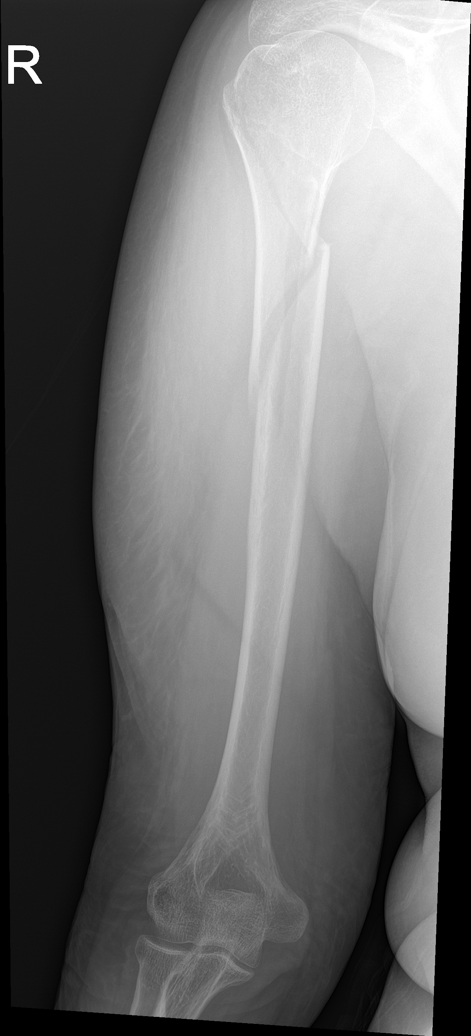

[humerus lat (1 of 3)]
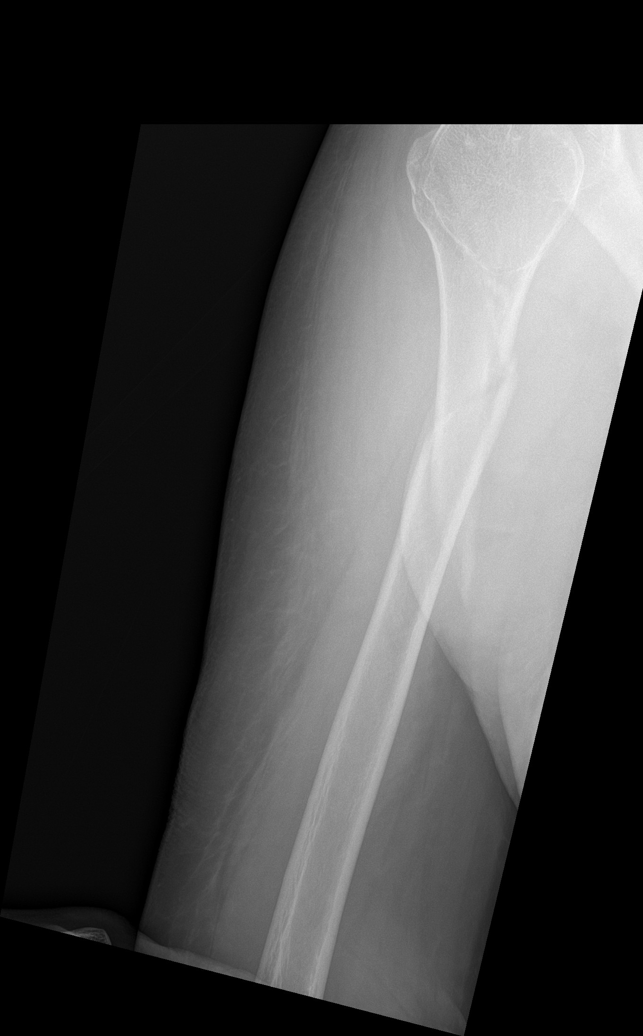

[humerus ap (2 of 3)]
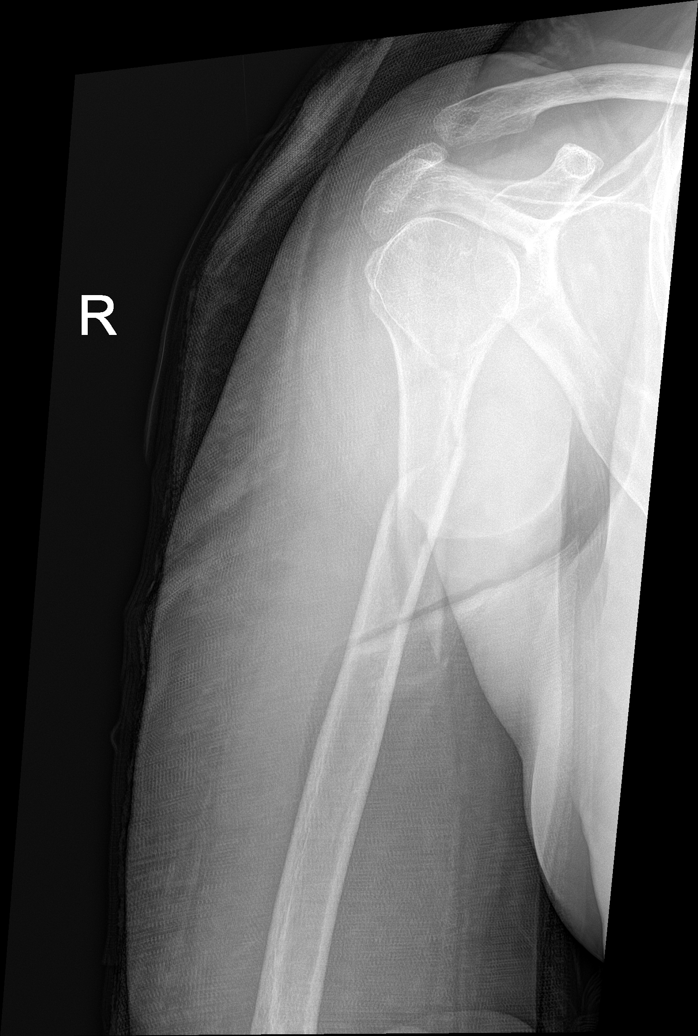

[humerus lat (2 of 3)]
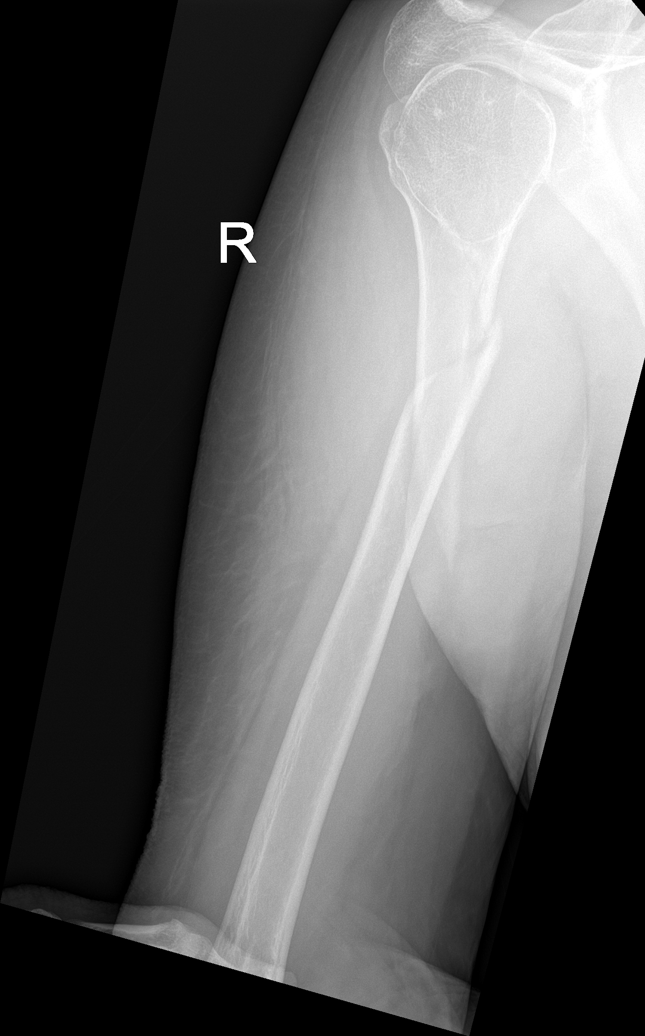

[humerus ap (3 of 3)]
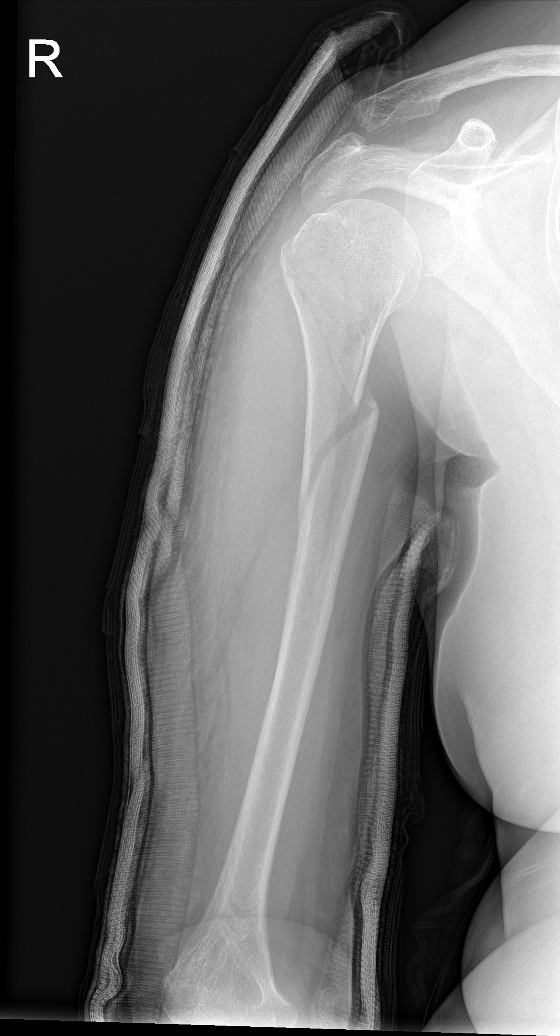

[humerus lat (3 of 3)]
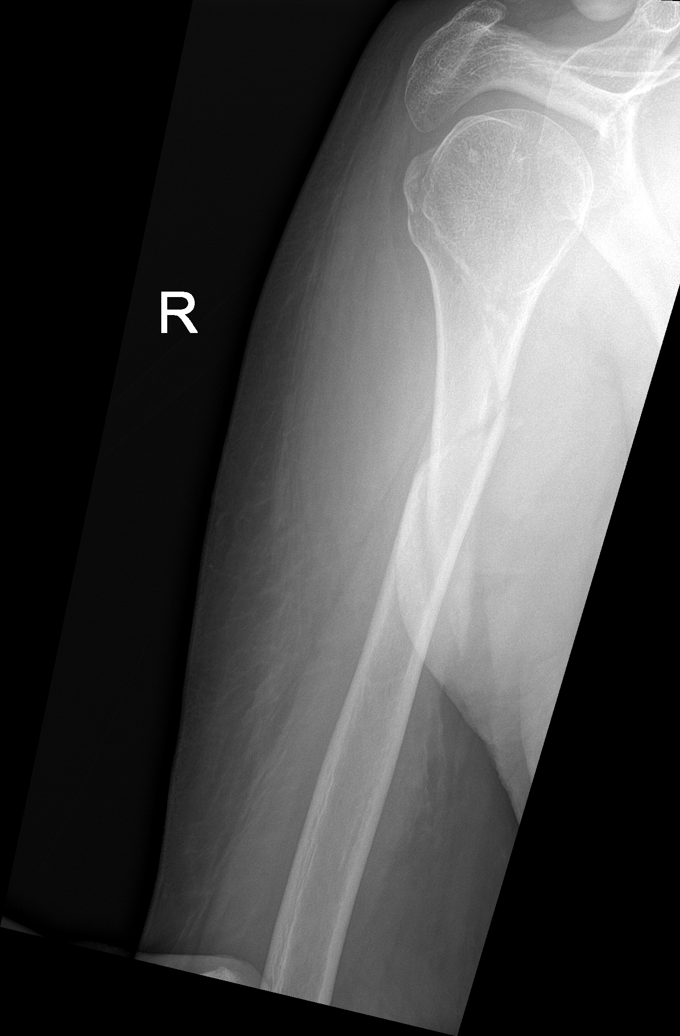

[6 of 6 positions shown; findings below may reference images not displayed]

FINDINGS: There has been partial reduction of spiral right proximal humeral
shaft fracture with decreased angulation of fracture components.
IMPRESSION: Partial reduction of spiral right proximal humeral shaft fracture
with decreased angulation of the fracture components.

## 2022-04-24 ENCOUNTER — Encounter (HOSPITAL_COMMUNITY): Payer: Medicaid Other | Admitting: Occupational Therapy

## 2022-09-23 ENCOUNTER — Ambulatory Visit: Payer: Medicaid Other | Admitting: Orthopedic Surgery

## 2022-12-19 ENCOUNTER — Encounter: Payer: Self-pay | Admitting: Radiology

## 2024-08-09 ENCOUNTER — Ambulatory Visit: Payer: Self-pay

## 2024-08-09 NOTE — Telephone Encounter (Signed)
 FYI Only or Action Required?: FYI only for provider.  Called Nurse Triage reporting Headache.  Symptoms began a week ago.  Interventions attempted: OTC medications: Tylenol .  Symptoms are: gradually worsening.  Triage Disposition: See Physician Within 24 Hours  Patient/caregiver understands and will follow disposition?: Yes, will call PCP's office       Copied from CRM [id number]. Topic: Clinical - Red Word Triage >> Aug 09, 2024 12:21 PM Yolanda T wrote: Red Word that prompted transfer to Nurse Triage: patient said she has had a headache since last Monday. Patient says it goes away when she go to sleep but it comes right back when she wake up and its a sharp pain goes from left to right. Reason for Disposition  [1] MODERATE headache (e.g., interferes with normal activities) AND [2] present > 24 hours AND [3] unexplained  (Exceptions: Pain medicines not tried, typical migraine, or headache part of viral illness.)  Answer Assessment - Initial Assessment Questions 1. LOCATION: Where does it hurt?      Left to right  2. ONSET: When did the headache start? (e.g., minutes, hours, days)      1 week 3. PATTERN: Does the pain come and go, or has it been constant since it started?     Intermittent  4. SEVERITY: How bad is the pain? and What does it keep you from doing?  (e.g., Scale 1-10; mild, moderate, or severe)     Moderate  5. RECURRENT SYMPTOM: Have you ever had headaches before? If Yes, ask: When was the last time? and What happened that time?      Yes 6. CAUSE: What do you think is causing the headache?     Unsure  7. MIGRAINE: Have you been diagnosed with migraine headaches? If Yes, ask: Is this headache similar?      No 8. HEAD INJURY: Has there been any recent injury to your head?      No 9. OTHER SYMPTOMS: Do you have any other symptoms? (e.g., fever, stiff neck, eye pain, sore throat, cold symptoms)     N  Protocols used: Headache-A-AH
